# Patient Record
Sex: Female | Born: 1980 | Race: White | Hispanic: No | Marital: Single | Smoking: Current every day smoker
Health system: Southern US, Community
[De-identification: ages and names within clinical notes are randomized; demographics above are authoritative.]

## PROBLEM LIST (undated history)

## (undated) DIAGNOSIS — N200 Calculus of kidney: Secondary | ICD-10-CM

## (undated) DIAGNOSIS — F419 Anxiety disorder, unspecified: Secondary | ICD-10-CM

## (undated) DIAGNOSIS — N189 Chronic kidney disease, unspecified: Secondary | ICD-10-CM

## (undated) DIAGNOSIS — I219 Acute myocardial infarction, unspecified: Secondary | ICD-10-CM

## (undated) DIAGNOSIS — F1920 Other psychoactive substance dependence, uncomplicated: Secondary | ICD-10-CM

## (undated) DIAGNOSIS — F32A Depression, unspecified: Secondary | ICD-10-CM

## (undated) DIAGNOSIS — F329 Major depressive disorder, single episode, unspecified: Secondary | ICD-10-CM

## (undated) DIAGNOSIS — I82409 Acute embolism and thrombosis of unspecified deep veins of unspecified lower extremity: Secondary | ICD-10-CM

## (undated) HISTORY — PX: KIDNEY STONE SURGERY: SHX686

## (undated) HISTORY — PX: NASAL SINUS SURGERY: SHX719

---

## 2003-12-29 ENCOUNTER — Ambulatory Visit: Payer: Self-pay | Admitting: Unknown Physician Specialty

## 2004-10-14 ENCOUNTER — Emergency Department: Payer: Self-pay | Admitting: Emergency Medicine

## 2004-11-29 ENCOUNTER — Emergency Department: Payer: Self-pay | Admitting: Emergency Medicine

## 2005-01-16 ENCOUNTER — Ambulatory Visit: Payer: Self-pay | Admitting: Urology

## 2005-03-22 ENCOUNTER — Observation Stay: Payer: Self-pay | Admitting: Obstetrics & Gynecology

## 2005-03-24 ENCOUNTER — Ambulatory Visit: Payer: Self-pay | Admitting: Obstetrics & Gynecology

## 2005-03-24 ENCOUNTER — Ambulatory Visit: Payer: Self-pay | Admitting: Urology

## 2005-05-08 ENCOUNTER — Observation Stay: Payer: Self-pay | Admitting: Obstetrics & Gynecology

## 2005-06-04 ENCOUNTER — Observation Stay: Payer: Self-pay

## 2005-06-05 ENCOUNTER — Inpatient Hospital Stay: Payer: Self-pay

## 2005-08-13 ENCOUNTER — Emergency Department: Payer: Self-pay | Admitting: Emergency Medicine

## 2005-09-21 ENCOUNTER — Ambulatory Visit: Payer: Self-pay | Admitting: Urology

## 2005-09-26 ENCOUNTER — Ambulatory Visit: Payer: Self-pay | Admitting: Urology

## 2005-09-28 ENCOUNTER — Ambulatory Visit: Payer: Self-pay | Admitting: Urology

## 2005-10-01 ENCOUNTER — Emergency Department: Payer: Self-pay | Admitting: Emergency Medicine

## 2005-10-05 ENCOUNTER — Ambulatory Visit: Payer: Self-pay | Admitting: Urology

## 2005-11-24 ENCOUNTER — Inpatient Hospital Stay: Payer: Self-pay | Admitting: Psychiatry

## 2006-01-16 ENCOUNTER — Emergency Department: Payer: Self-pay | Admitting: Emergency Medicine

## 2006-01-22 ENCOUNTER — Ambulatory Visit: Payer: Self-pay | Admitting: Urology

## 2006-01-30 ENCOUNTER — Ambulatory Visit: Payer: Self-pay | Admitting: Urology

## 2006-02-21 ENCOUNTER — Ambulatory Visit: Payer: Self-pay | Admitting: Urology

## 2006-03-26 ENCOUNTER — Inpatient Hospital Stay: Payer: Self-pay | Admitting: Urology

## 2006-04-07 ENCOUNTER — Emergency Department: Payer: Self-pay | Admitting: General Practice

## 2006-04-25 ENCOUNTER — Ambulatory Visit: Payer: Self-pay | Admitting: Urology

## 2006-05-21 ENCOUNTER — Ambulatory Visit: Payer: Self-pay | Admitting: Pain Medicine

## 2006-05-23 ENCOUNTER — Ambulatory Visit: Payer: Self-pay | Admitting: Pain Medicine

## 2006-05-31 ENCOUNTER — Ambulatory Visit: Payer: Self-pay | Admitting: Neonatology

## 2006-05-31 ENCOUNTER — Ambulatory Visit: Payer: Self-pay | Admitting: Family Medicine

## 2006-06-13 ENCOUNTER — Ambulatory Visit: Payer: Self-pay | Admitting: Pain Medicine

## 2006-07-03 ENCOUNTER — Ambulatory Visit: Payer: Self-pay | Admitting: Pain Medicine

## 2006-07-23 ENCOUNTER — Ambulatory Visit: Payer: Self-pay | Admitting: Physician Assistant

## 2006-07-26 ENCOUNTER — Ambulatory Visit: Payer: Self-pay | Admitting: Urology

## 2006-09-14 ENCOUNTER — Emergency Department: Payer: Self-pay | Admitting: Emergency Medicine

## 2007-04-10 ENCOUNTER — Inpatient Hospital Stay: Payer: Self-pay | Admitting: Psychiatry

## 2007-07-18 ENCOUNTER — Emergency Department: Payer: Self-pay | Admitting: Emergency Medicine

## 2008-02-21 DIAGNOSIS — I82409 Acute embolism and thrombosis of unspecified deep veins of unspecified lower extremity: Secondary | ICD-10-CM

## 2008-02-21 HISTORY — DX: Acute embolism and thrombosis of unspecified deep veins of unspecified lower extremity: I82.409

## 2008-08-23 IMAGING — CT CT ABD-PELV W/O CM
1 of 2 series · 15 of 32 positions shown, 19 images · non-contrast
Comparison: none

REASON FOR EXAM: Nephrolithiasis, renal colic
COMMENTS:

PROCEDURE:     CT  - CT ABDOMEN AND PELVIS W[DATE]  [DATE]
RESULT:     The patient has a history of LEFT nephrolithiasis.
TECHNIQUE: Axial images were obtained from the hemidiaphragm to the pubic
symphysis without intravenous or oral administration of contrast.

[Series 2: stone · axial · 0.61mm/px · z∈[-312,+24]mm · 15 of 126 slices shown, 19 images]
[im 9/126  soft-tissue]
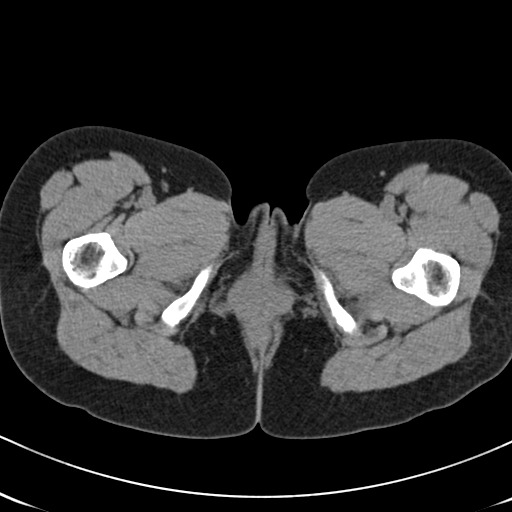
[im 9/126  bone]
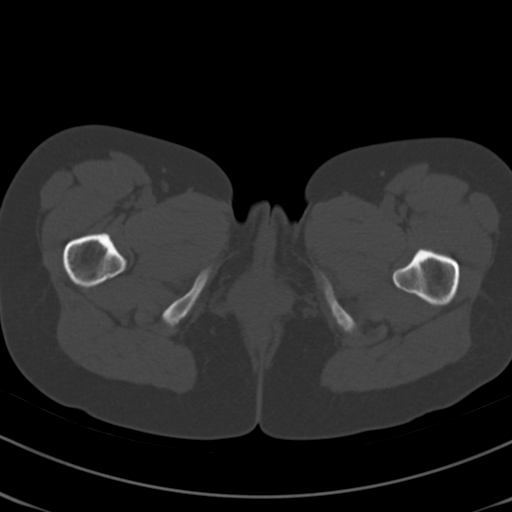
[im 18/126  soft-tissue]
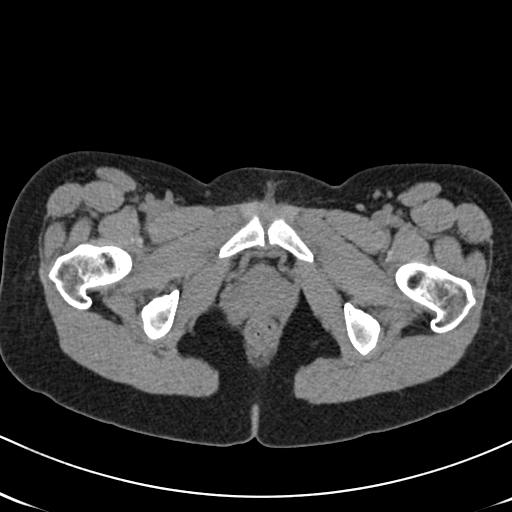
[im 27/126  soft-tissue]
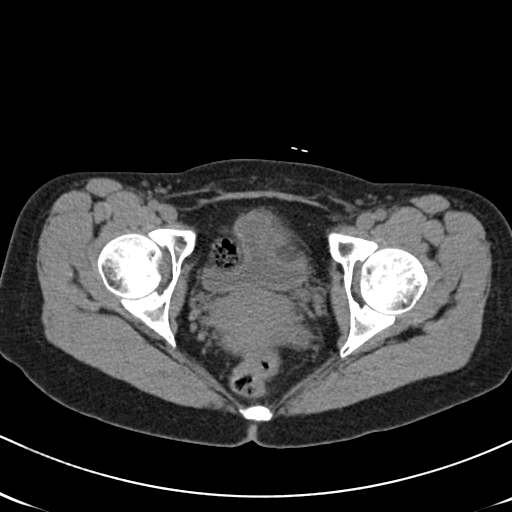
[im 36/126  soft-tissue]
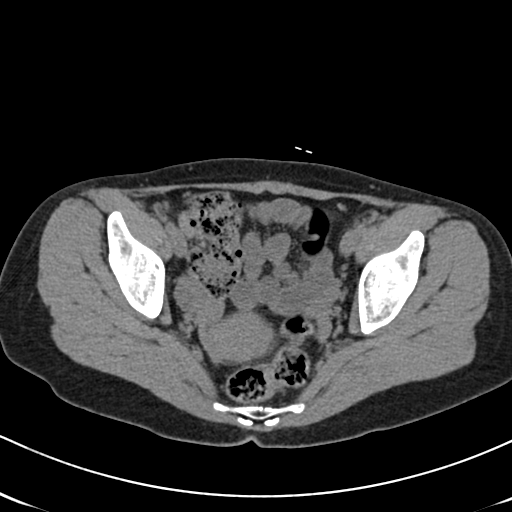
[im 45/126  soft-tissue]
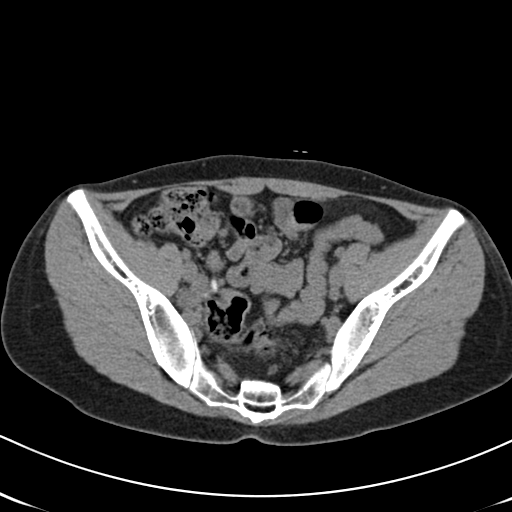
[im 54/126  soft-tissue]
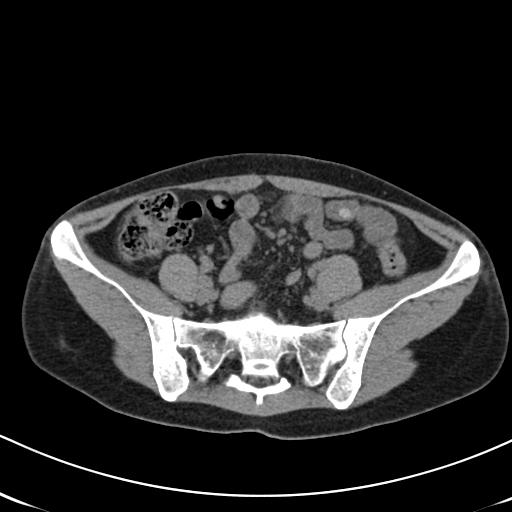
[im 63/126  soft-tissue]
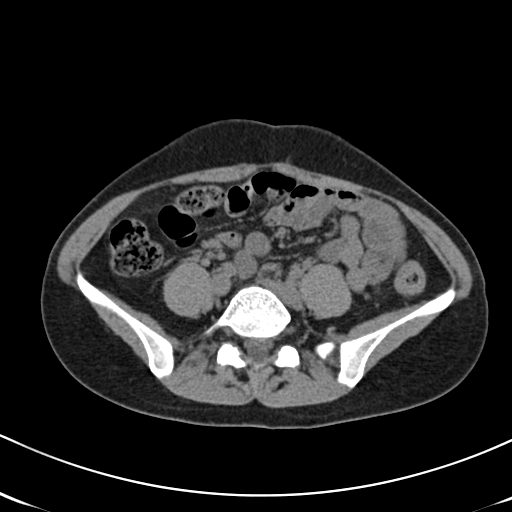
[im 72/126  soft-tissue]
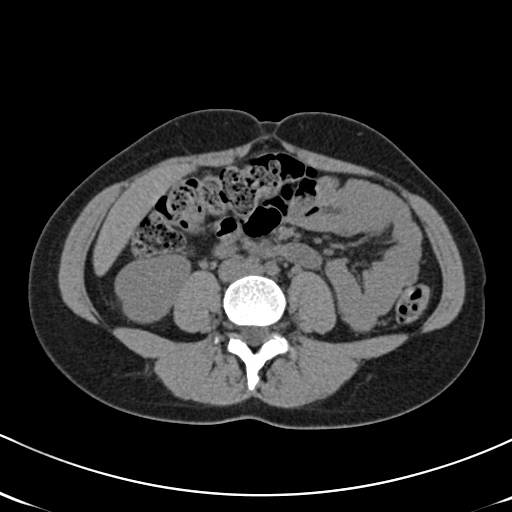
[im 81/126  soft-tissue]
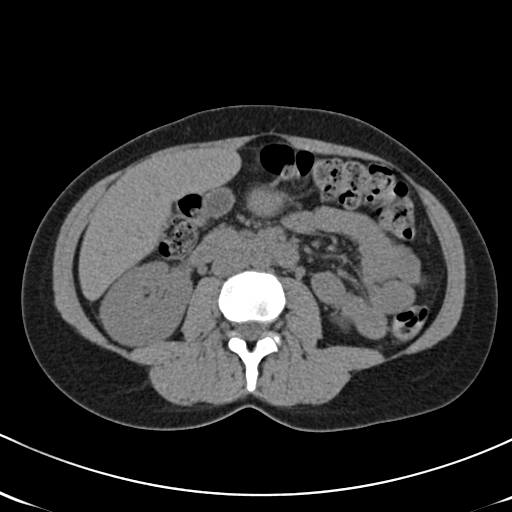
[im 81/126  bone]
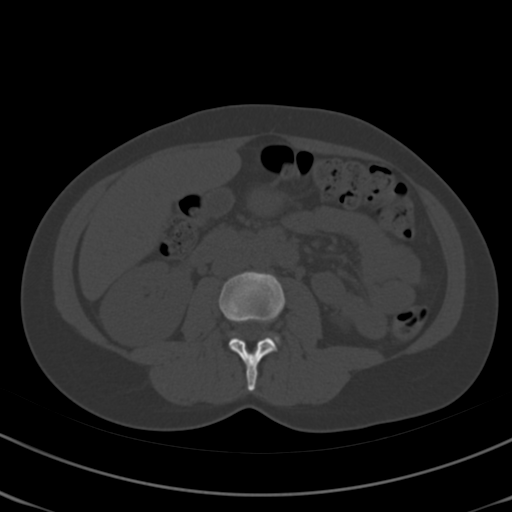
[im 90/126  soft-tissue]
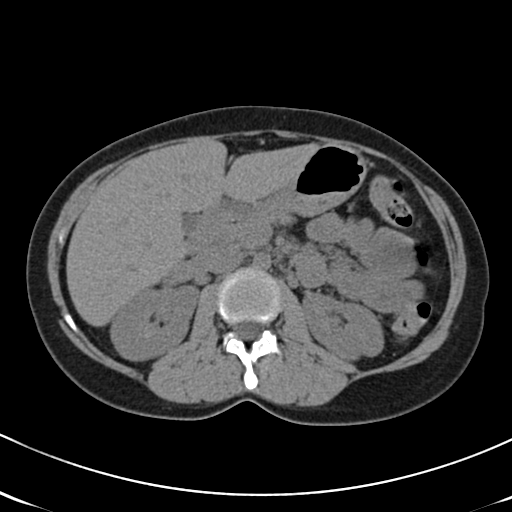
[im 99/126  soft-tissue]
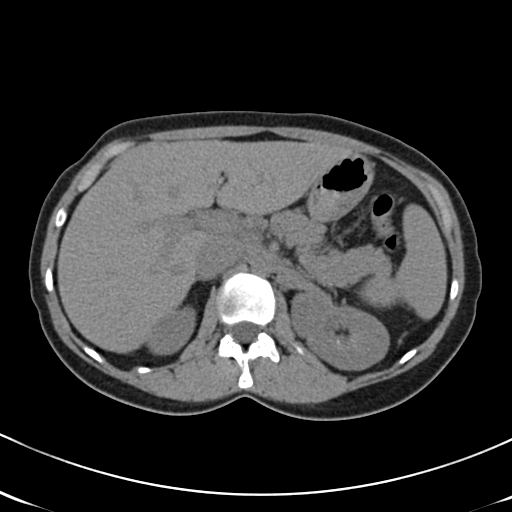
[im 108/126  soft-tissue]
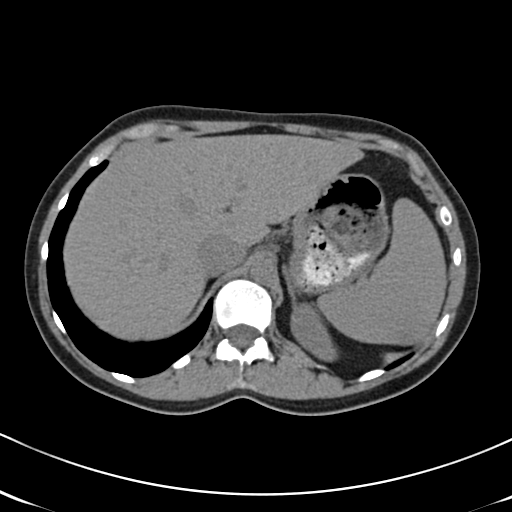
[im 108/126  lung]
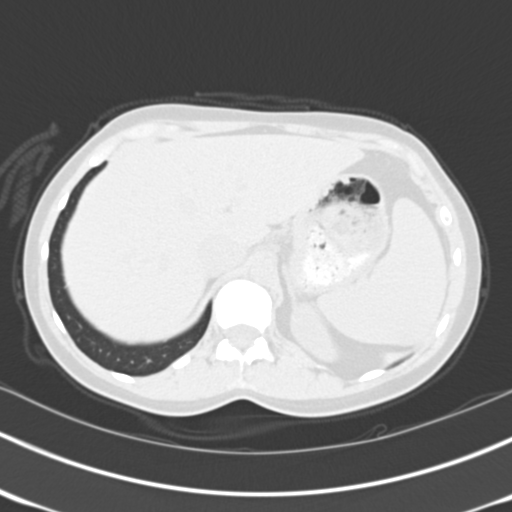
[im 112/126  lung]
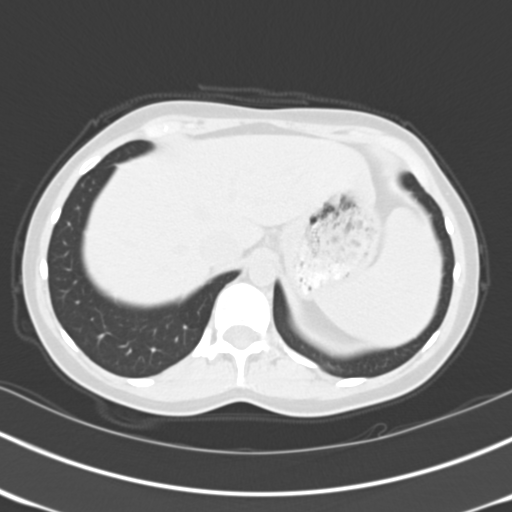
[im 117/126  soft-tissue]
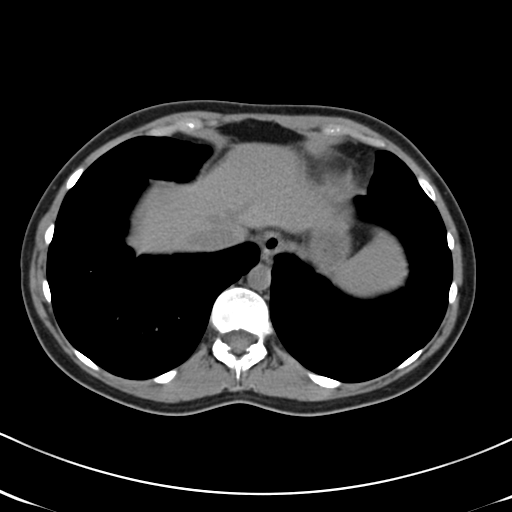
[im 117/126  lung]
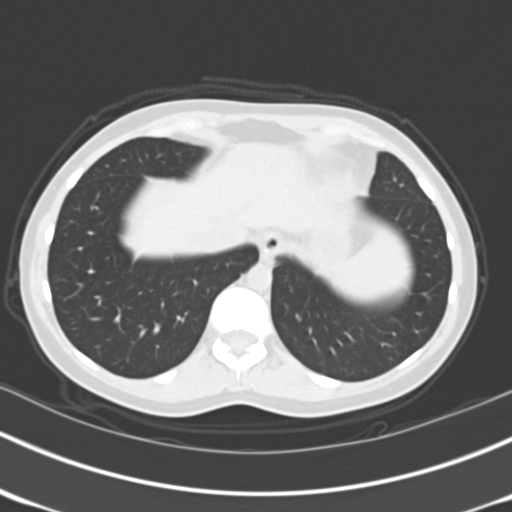
[im 121/126  lung]
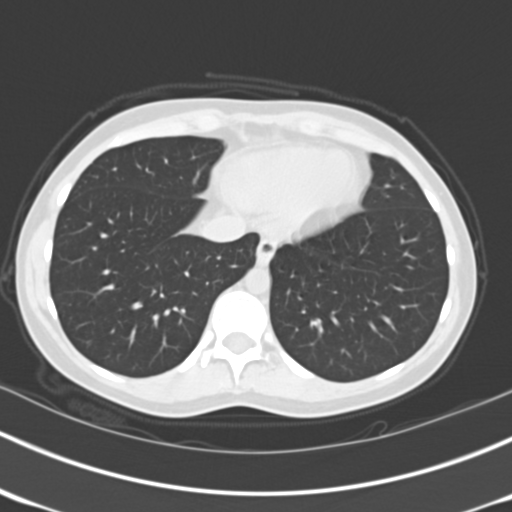

[15 of 32 positions shown; findings below may reference images not displayed]

FINDINGS: There are noted two, renal calculi in the LEFT kidney with one in
the mid pole measuring approximately 4.0 mm and one in the mid to lower pole
measuring slightly over 3.0 mm. There is a cyst in the upper pole of the
LEFT kidney. No calculi are noted on the RIGHT. No hydronephrosis is seen.
No ureteral calculi are seen. Bilateral, ovarian, small cysts are noted.

No ascites is present. No definite diverticulitis or appendicitis is seen.

Images through the lung bases reveal the lung bases to be clear with no
effusion.
IMPRESSION: 1.     Two calculi are noted in the LEFT kidney as well as a small cyst.
2.     No hydronephrosis.
3.     No ureteral calculi are identified.
4.     Small ovarian cysts are present bilaterally.

## 2008-12-16 ENCOUNTER — Observation Stay: Payer: Self-pay | Admitting: Unknown Physician Specialty

## 2008-12-26 ENCOUNTER — Inpatient Hospital Stay: Payer: Self-pay | Admitting: Obstetrics and Gynecology

## 2009-01-04 ENCOUNTER — Emergency Department: Payer: Self-pay | Admitting: Emergency Medicine

## 2009-01-15 ENCOUNTER — Ambulatory Visit: Payer: Self-pay | Admitting: Family Medicine

## 2009-10-27 ENCOUNTER — Ambulatory Visit: Payer: Self-pay

## 2009-12-15 ENCOUNTER — Ambulatory Visit: Payer: Self-pay | Admitting: Obstetrics and Gynecology

## 2009-12-15 LAB — CONVERTED CEMR LAB
AntiThromb III Func: 109 % (ref 76–126)
Antibody Screen: NEGATIVE
Anticardiolipin IgA: 8 (ref <22)
Anticardiolipin IgG: 60 — ABNORMAL HIGH (ref <23)
Anticardiolipin IgM: 7 (ref <11)
Basophils Absolute: 0 10*3/uL (ref 0.0–0.1)
Basophils Relative: 0 % (ref 0–1)
Chlamydia, DNA Probe: NEGATIVE
Eosinophils Absolute: 0.1 10*3/uL (ref 0.0–0.7)
Eosinophils Relative: 1 % (ref 0–5)
GC Probe Amp, Genital: NEGATIVE
HCT: 32.3 % — ABNORMAL LOW (ref 36.0–46.0)
HIV: NONREACTIVE
Hemoglobin: 10.6 g/dL — ABNORMAL LOW (ref 12.0–15.0)
Hepatitis B Surface Ag: NEGATIVE
Lymphocytes Relative: 32 % (ref 12–46)
Lymphs Abs: 2.7 10*3/uL (ref 0.7–4.0)
MCHC: 32.8 g/dL (ref 30.0–36.0)
MCV: 97 fL (ref 78.0–100.0)
Monocytes Absolute: 0.5 10*3/uL (ref 0.1–1.0)
Monocytes Relative: 6 % (ref 3–12)
Neutro Abs: 5.1 10*3/uL (ref 1.7–7.7)
Neutrophils Relative %: 61 % (ref 43–77)
Platelets: 199 10*3/uL (ref 150–400)
Protein C Activity: 152 % — ABNORMAL HIGH (ref 75–133)
Protein S Activity: 106 % (ref 69–129)
Protein S Ag, Total: 70 % (ref 70–140)
RBC: 3.33 M/uL — ABNORMAL LOW (ref 3.87–5.11)
RDW: 14.8 % (ref 11.5–15.5)
Rh Type: POSITIVE
Rubella: 306.9 intl units/mL — ABNORMAL HIGH
WBC: 8.4 10*3/uL (ref 4.0–10.5)

## 2009-12-16 ENCOUNTER — Ambulatory Visit (HOSPITAL_COMMUNITY)
Admission: RE | Admit: 2009-12-16 | Discharge: 2009-12-16 | Payer: Self-pay | Source: Home / Self Care | Admitting: Family Medicine

## 2009-12-23 ENCOUNTER — Ambulatory Visit: Payer: Self-pay | Admitting: Family Medicine

## 2010-01-10 ENCOUNTER — Ambulatory Visit: Payer: Self-pay | Admitting: Obstetrics & Gynecology

## 2010-02-03 ENCOUNTER — Ambulatory Visit: Payer: Self-pay | Admitting: Obstetrics & Gynecology

## 2010-02-10 ENCOUNTER — Ambulatory Visit (HOSPITAL_COMMUNITY)
Admission: RE | Admit: 2010-02-10 | Discharge: 2010-02-10 | Payer: Self-pay | Source: Home / Self Care | Attending: Obstetrics & Gynecology | Admitting: Obstetrics & Gynecology

## 2010-02-24 ENCOUNTER — Ambulatory Visit: Admit: 2010-02-24 | Payer: Self-pay | Admitting: Obstetrics & Gynecology

## 2010-03-03 ENCOUNTER — Ambulatory Visit
Admission: RE | Admit: 2010-03-03 | Discharge: 2010-03-03 | Payer: Self-pay | Source: Home / Self Care | Attending: Obstetrics and Gynecology | Admitting: Obstetrics and Gynecology

## 2010-03-07 LAB — POCT URINALYSIS DIPSTICK
Bilirubin Urine: NEGATIVE
Hgb urine dipstick: NEGATIVE
Nitrite: NEGATIVE
Protein, ur: NEGATIVE mg/dL
Specific Gravity, Urine: >=1.03 (ref 1.005–1.030)
Urine Glucose, Fasting: NEGATIVE mg/dL
Urobilinogen, UA: 1 mg/dL (ref 0.0–1.0)
pH: 6 (ref 5.0–8.0)

## 2010-03-10 ENCOUNTER — Ambulatory Visit: Admit: 2010-03-10 | Payer: Self-pay | Admitting: Obstetrics & Gynecology

## 2010-03-13 ENCOUNTER — Encounter: Payer: Self-pay | Admitting: *Deleted

## 2010-03-28 ENCOUNTER — Inpatient Hospital Stay (HOSPITAL_COMMUNITY)
Admission: AD | Admit: 2010-03-28 | Discharge: 2010-03-31 | DRG: 782 | Disposition: A | Discharge status: Home / Self Care | Payer: Medicaid Other | Source: Ambulatory Visit | Attending: Obstetrics & Gynecology | Admitting: Obstetrics & Gynecology

## 2010-03-28 DIAGNOSIS — I82409 Acute embolism and thrombosis of unspecified deep veins of unspecified lower extremity: Principal | ICD-10-CM | POA: Diagnosis present

## 2010-03-28 DIAGNOSIS — O2230 Deep phlebothrombosis in pregnancy, unspecified trimester: Secondary | ICD-10-CM

## 2010-03-28 DIAGNOSIS — I80299 Phlebitis and thrombophlebitis of other deep vessels of unspecified lower extremity: Secondary | ICD-10-CM

## 2010-03-28 LAB — COMPREHENSIVE METABOLIC PANEL
ALT: 11 U/L (ref 0–35)
AST: 15 U/L (ref 0–37)
Albumin: 2.3 g/dL — ABNORMAL LOW (ref 3.5–5.2)
Alkaline Phosphatase: 103 U/L (ref 39–117)
BUN: 8 mg/dL (ref 6–23)
CO2: 25 mEq/L (ref 19–32)
Calcium: 8.1 mg/dL — ABNORMAL LOW (ref 8.4–10.5)
Chloride: 104 mEq/L (ref 96–112)
Creatinine, Ser: 0.56 mg/dL (ref 0.4–1.2)
GFR calc Af Amer: >60 mL/min (ref >60)
GFR calc non Af Amer: >60 mL/min (ref >60)
Glucose, Bld: 72 mg/dL (ref 70–99)
Potassium: 4.3 mEq/L (ref 3.5–5.1)
Sodium: 135 mEq/L (ref 135–145)
Total Bilirubin: 0.2 mg/dL — ABNORMAL LOW (ref 0.3–1.2)
Total Protein: 6.1 g/dL (ref 6.0–8.3)

## 2010-03-28 LAB — CBC
HCT: 27.3 % — ABNORMAL LOW (ref 36.0–46.0)
Hemoglobin: 8.9 g/dL — ABNORMAL LOW (ref 12.0–15.0)
MCH: 28.4 pg (ref 26.0–34.0)
MCHC: 32.6 g/dL (ref 30.0–36.0)
MCV: 87.2 fL (ref 78.0–100.0)
Platelets: 135 10*3/uL — ABNORMAL LOW (ref 150–400)
RBC: 3.13 MIL/uL — ABNORMAL LOW (ref 3.87–5.11)
RDW: 13.3 % (ref 11.5–15.5)
WBC: 8.6 10*3/uL (ref 4.0–10.5)

## 2010-03-28 LAB — RAPID URINE DRUG SCREEN, HOSP PERFORMED
Amphetamines: NOT DETECTED
Barbiturates: NOT DETECTED
Benzodiazepines: NOT DETECTED
Cocaine: NOT DETECTED
Opiates: NOT DETECTED
Tetrahydrocannabinol: NOT DETECTED

## 2010-03-28 LAB — APTT: aPTT: 49 seconds — ABNORMAL HIGH (ref 24–37)

## 2010-03-28 LAB — PROTIME-INR
INR: 1 (ref 0.00–1.49)
Prothrombin Time: 13.4 seconds (ref 11.6–15.2)

## 2010-03-28 LAB — HEPARIN LEVEL (UNFRACTIONATED): Heparin Unfractionated: 0.12 IU/mL — ABNORMAL LOW (ref 0.30–0.70)

## 2010-03-29 DIAGNOSIS — O2230 Deep phlebothrombosis in pregnancy, unspecified trimester: Secondary | ICD-10-CM

## 2010-03-29 DIAGNOSIS — I82409 Acute embolism and thrombosis of unspecified deep veins of unspecified lower extremity: Secondary | ICD-10-CM

## 2010-03-29 LAB — CBC
HCT: 23.9 % — ABNORMAL LOW (ref 36.0–46.0)
Hemoglobin: 7.9 g/dL — ABNORMAL LOW (ref 12.0–15.0)
MCH: 28.7 pg (ref 26.0–34.0)
MCHC: 33.1 g/dL (ref 30.0–36.0)
MCV: 86.9 fL (ref 78.0–100.0)
Platelets: 121 10*3/uL — ABNORMAL LOW (ref 150–400)
RBC: 2.75 MIL/uL — ABNORMAL LOW (ref 3.87–5.11)
RDW: 13.4 % (ref 11.5–15.5)
WBC: 7.6 10*3/uL (ref 4.0–10.5)

## 2010-03-29 LAB — HEPARIN LEVEL (UNFRACTIONATED): Heparin Unfractionated: <0.1 IU/mL — ABNORMAL LOW (ref 0.30–0.70)

## 2010-03-30 DIAGNOSIS — I82409 Acute embolism and thrombosis of unspecified deep veins of unspecified lower extremity: Secondary | ICD-10-CM

## 2010-03-30 DIAGNOSIS — O2230 Deep phlebothrombosis in pregnancy, unspecified trimester: Secondary | ICD-10-CM

## 2010-03-30 LAB — CBC
HCT: 25.4 % — ABNORMAL LOW (ref 36.0–46.0)
Hemoglobin: 8.2 g/dL — ABNORMAL LOW (ref 12.0–15.0)
MCH: 27.9 pg (ref 26.0–34.0)
MCHC: 32.3 g/dL (ref 30.0–36.0)
MCV: 86.4 fL (ref 78.0–100.0)
Platelets: 122 10*3/uL — ABNORMAL LOW (ref 150–400)
RBC: 2.94 MIL/uL — ABNORMAL LOW (ref 3.87–5.11)
RDW: 13.3 % (ref 11.5–15.5)
WBC: 7.2 10*3/uL (ref 4.0–10.5)

## 2010-03-30 LAB — GLUCOSE TOLERANCE, 1 HOUR: Glucose, 1 Hour GTT: 147 mg/dL — ABNORMAL HIGH (ref 70–140)

## 2010-03-30 LAB — HEPARIN ANTI-XA: Heparin LMW: 0.63 IU/mL

## 2010-03-31 DIAGNOSIS — O2230 Deep phlebothrombosis in pregnancy, unspecified trimester: Secondary | ICD-10-CM

## 2010-03-31 DIAGNOSIS — I82409 Acute embolism and thrombosis of unspecified deep veins of unspecified lower extremity: Secondary | ICD-10-CM

## 2010-03-31 LAB — GLUCOSE, 1 HOUR GESTATIONAL: Glucose Tolerance, 1 hour: 120 mg/dL (ref 70–189)

## 2010-03-31 LAB — CBC
HCT: 26.1 % — ABNORMAL LOW (ref 36.0–46.0)
Hemoglobin: 8.4 g/dL — ABNORMAL LOW (ref 12.0–15.0)
MCH: 27.8 pg (ref 26.0–34.0)
MCHC: 32.2 g/dL (ref 30.0–36.0)
MCV: 86.4 fL (ref 78.0–100.0)
Platelets: 122 10*3/uL — ABNORMAL LOW (ref 150–400)
RBC: 3.02 MIL/uL — ABNORMAL LOW (ref 3.87–5.11)
RDW: 13.3 % (ref 11.5–15.5)
WBC: 8.4 10*3/uL (ref 4.0–10.5)

## 2010-03-31 LAB — GLUCOSE, 3 HOUR GESTATIONAL: Glucose, GTT - 3 Hour: 102 mg/dL (ref 70–144)

## 2010-03-31 LAB — GLUCOSE, 2 HOUR GESTATIONAL: Glucose Tolerance, 2 hour: 125 mg/dL (ref 70–164)

## 2010-03-31 LAB — GLUCOSE, FASTING GESTATIONAL: Glucose Tolerance, Fasting: 85 mg/dL

## 2010-04-07 NOTE — Discharge Summary (Signed)
NAMENAYELY, DINGUS              ACCOUNT NO.:  [id number]  MEDICAL RECORD NO.:  [id number]           PATIENT TYPE:  LOCATION:                                 FACILITY:  PHYSICIAN:  Lucina Mellow, DO   DATE OF BIRTH:  February 15, 1981  DATE OF ADMISSION:  03/28/2010 DATE OF DISCHARGE:  03/31/2010                              DISCHARGE SUMMARY   DIAGNOSIS:  Extensive right lower extremity deep venous thrombosis.  HISTORY OF PRESENT ILLNESS:  The patient is a 30 year old female gravida 5, para 4-0-0-4 who presented to the MAU with the chief complaint of lower extremity swelling.  She was on the Lovenox for history of a postpartum DVT in the past but upon presentation to MAU, she had admitted to stopping her Lovenox about 3-4 weeks prior to presentation "because I was a bad patient.  I did not think I needed it because I never had a DVT during pregnancy."  The patient is also on methadone and has been on it for the past several years at a dose of 75-80 mg a day. She has never had her dose weaned because she did want it weaned.  At presentation, she did not complain of shortness of breath or chest pain and she at that time also denied leaking, bleeding from the vagina and she reported good fetal movement.  PAST MEDICAL HISTORY:  Anemia, bipolar disorder, depression, DVT, hernia.  FAMILY HISTORY:  Diabetes, drug abuse, alcohol abuse.  SURGICAL HISTORY:  Sinus surgery, kidney for surgical biopsy, lithotripsy of the bladder with calculus.  SOCIAL HISTORY:  She is a nondrinker.  She is a current smoker with former drug abuse and currently is on methadone.  OBSTETRICAL HISTORY:  She has had regular prenatal care and has a history of ovarian cysts.  MEDICATIONS:  Lovenox she was on 40 mg daily subcu, Vistaril 50 mg p.o. at bedtime, methadone 80 mg p.o. daily, and prenatal vitamins.  The patient had an ultrasound done in the MAU which failed an extensive right lower extremity DVT.   Her laboratory values, she had a urine drug screen which was negative, a renal panel which was normal, and a CBC which showed a hemoglobin of 8.9, hematocrit of 27.3, and platelets of 135.  HOSPITAL COURSE:  The patient was admitted to the hospital and placed on heparin protocol per the pharmacy and she received 5000 units IV as a bolus and then maintenance infusion of 1160 units per hour and a heparin level was checked 6 hours after the heparin infusion.  Later in the day after admission, she received a 2000 units bolus and her heparin drip was increased to 1350 units per hour and again an unfractionated heparin level was checked.  The morning of February 7, she received another bolus of 2000 units and her infusion was increased to 1680 units per hour.  Later in the day on February 7, her heparin drip was discontinued and she was started on Lovenox 70 mg subcu q.12 h. and she had her 1- hour Glucola test on February 7, which came back elevated.  Her 1-hour Glucola was 147 and she  was written to have a 3-hour glucose tolerance test prior to discharge.  On February 9, the patient was seen and she was doing well.  Her pain continued in her low right lower extremity; however, she declined any pain medicine at this time.  She was premedicated with Zofran prior to her glucose tolerance test and a decision was made that she could be discharged home after the last blood draw for her 3-hour glucose tolerance test.  She was seen by social work, the note is in the chart.  She was seen by pharmacy for the heparin titration.  She continued to receive her 80 mg a day of her methadone during her admission.  Her platelet count remained somewhat low.  The lowest it got was 122, this will need to be followed.  On discharge, the patient's vital signs are 96/41, heart rate is 73, respiratory rate of 18, temperature 97.9.  In general she is awake, alert, and oriented in no apparent distress.  Her heart rate  is regular rate and rhythm.  She has a 2/6 systolic ejection murmur, best heard at left sternal border.  Her lungs were clear to auscultation bilaterally. Extremity, she does have pulses bilaterally.  She has 2+ pitting edema in her right lower extremity, mostly the foot with increased sensitivity to plain gentle touch on her foot.  There is continued swelling of the right lower extremity to the knee.  Her NST was reactive with a fetal heart rate in the 140s.  ASSESSMENT:  A 30 year old gravida 5, para 4-0-0-4 at 31-5/7 weeks with the right lower extremity deep venous thrombosis.  She is now on Lovenox.  She will be discharged home today on 70 mg subcu Lovenox b.i.d.  She will be discharged today only after she has gotten her third blood draw for her Glucola test.  We will give her Zofran for p.r.n. nausea after discharge.  She declines pain medications for her pain in her right lower extremity.  She will continue on [facility] and her followup appointment is scheduled for February 16 at 9:45 in the morning.  The patient voices understanding and agrees with these plans.          ______________________________ Lucina Mellow, DO     SH/MEDQ  D:  03/31/2010  T:  04/01/2010  Job:  [id number]  Electronically Signed by Lucina Mellow MD on 04/07/2010 07:06:39 PM

## 2010-04-14 ENCOUNTER — Other Ambulatory Visit: Payer: Self-pay

## 2010-04-14 ENCOUNTER — Other Ambulatory Visit: Payer: Self-pay | Admitting: Obstetrics & Gynecology

## 2010-04-14 ENCOUNTER — Encounter: Payer: Self-pay | Admitting: Obstetrics and Gynecology

## 2010-04-14 DIAGNOSIS — Z8751 Personal history of pre-term labor: Secondary | ICD-10-CM

## 2010-04-14 DIAGNOSIS — I82409 Acute embolism and thrombosis of unspecified deep veins of unspecified lower extremity: Secondary | ICD-10-CM

## 2010-04-14 DIAGNOSIS — O2230 Deep phlebothrombosis in pregnancy, unspecified trimester: Secondary | ICD-10-CM

## 2010-04-14 LAB — POCT URINALYSIS DIPSTICK
Hgb urine dipstick: NEGATIVE
Nitrite: NEGATIVE
Protein, ur: 30 mg/dL — AB
Specific Gravity, Urine: >=1.03 (ref 1.005–1.030)
Urine Glucose, Fasting: 100 mg/dL — AB
Urobilinogen, UA: 1 mg/dL (ref 0.0–1.0)
pH: 6 (ref 5.0–8.0)

## 2010-04-14 LAB — GLUCOSE, CAPILLARY: Glucose-Capillary: 104 mg/dL — ABNORMAL HIGH (ref 70–99)

## 2010-04-14 LAB — CONVERTED CEMR LAB: HIV: NONREACTIVE

## 2010-04-19 ENCOUNTER — Ambulatory Visit (HOSPITAL_COMMUNITY)
Admission: RE | Admit: 2010-04-19 | Discharge: 2010-04-19 | Disposition: A | Discharge status: Home / Self Care | Payer: Medicaid Other | Source: Ambulatory Visit | Attending: Obstetrics & Gynecology | Admitting: Obstetrics & Gynecology

## 2010-04-19 ENCOUNTER — Encounter (HOSPITAL_COMMUNITY): Payer: Self-pay

## 2010-04-19 DIAGNOSIS — F1920 Other psychoactive substance dependence, uncomplicated: Secondary | ICD-10-CM | POA: Insufficient documentation

## 2010-04-19 DIAGNOSIS — F1120 Opioid dependence, uncomplicated: Secondary | ICD-10-CM | POA: Insufficient documentation

## 2010-04-19 DIAGNOSIS — O99330 Smoking (tobacco) complicating pregnancy, unspecified trimester: Secondary | ICD-10-CM | POA: Insufficient documentation

## 2010-04-19 DIAGNOSIS — O09219 Supervision of pregnancy with history of pre-term labor, unspecified trimester: Secondary | ICD-10-CM | POA: Insufficient documentation

## 2010-04-19 DIAGNOSIS — Z8751 Personal history of pre-term labor: Secondary | ICD-10-CM

## 2010-04-28 ENCOUNTER — Other Ambulatory Visit: Payer: Self-pay

## 2010-04-28 DIAGNOSIS — O99320 Drug use complicating pregnancy, unspecified trimester: Secondary | ICD-10-CM

## 2010-04-28 DIAGNOSIS — Z8751 Personal history of pre-term labor: Secondary | ICD-10-CM

## 2010-04-28 DIAGNOSIS — F1920 Other psychoactive substance dependence, uncomplicated: Secondary | ICD-10-CM

## 2010-04-28 DIAGNOSIS — O99330 Smoking (tobacco) complicating pregnancy, unspecified trimester: Secondary | ICD-10-CM

## 2010-04-28 DIAGNOSIS — O99340 Other mental disorders complicating pregnancy, unspecified trimester: Secondary | ICD-10-CM

## 2010-04-28 LAB — POCT URINALYSIS DIPSTICK
Bilirubin Urine: NEGATIVE
Hgb urine dipstick: NEGATIVE
Ketones, ur: NEGATIVE mg/dL
pH: 7 (ref 5.0–8.0)

## 2010-05-02 LAB — POCT URINALYSIS DIPSTICK
Ketones, ur: NEGATIVE mg/dL
Protein, ur: NEGATIVE mg/dL
Specific Gravity, Urine: 1.015 (ref 1.005–1.030)

## 2010-05-03 LAB — POCT URINALYSIS DIPSTICK
Bilirubin Urine: NEGATIVE
Glucose, UA: NEGATIVE mg/dL
Hgb urine dipstick: NEGATIVE
Hgb urine dipstick: NEGATIVE
Ketones, ur: NEGATIVE mg/dL
Ketones, ur: NEGATIVE mg/dL
Specific Gravity, Urine: 1.02 (ref 1.005–1.030)
Specific Gravity, Urine: 1.02 (ref 1.005–1.030)
Urobilinogen, UA: 0.2 mg/dL (ref 0.0–1.0)
pH: 7.5 (ref 5.0–8.0)

## 2010-05-04 LAB — POCT URINALYSIS DIPSTICK
Hgb urine dipstick: NEGATIVE
Ketones, ur: NEGATIVE mg/dL
Protein, ur: NEGATIVE mg/dL
Specific Gravity, Urine: 1.02 (ref 1.005–1.030)
pH: 8.5 — ABNORMAL HIGH (ref 5.0–8.0)

## 2010-05-05 ENCOUNTER — Encounter: Payer: Self-pay | Admitting: Physician Assistant

## 2010-05-05 ENCOUNTER — Other Ambulatory Visit: Payer: Self-pay

## 2010-05-05 DIAGNOSIS — F1920 Other psychoactive substance dependence, uncomplicated: Secondary | ICD-10-CM

## 2010-05-05 DIAGNOSIS — O99320 Drug use complicating pregnancy, unspecified trimester: Secondary | ICD-10-CM

## 2010-05-05 DIAGNOSIS — O99330 Smoking (tobacco) complicating pregnancy, unspecified trimester: Secondary | ICD-10-CM

## 2010-05-05 DIAGNOSIS — Z8751 Personal history of pre-term labor: Secondary | ICD-10-CM

## 2010-05-05 LAB — POCT URINALYSIS DIPSTICK
Hgb urine dipstick: NEGATIVE
Protein, ur: 100 mg/dL — AB
Specific Gravity, Urine: >=1.03 (ref 1.005–1.030)
Urobilinogen, UA: 1 mg/dL (ref 0.0–1.0)

## 2010-05-12 ENCOUNTER — Other Ambulatory Visit: Payer: Self-pay | Admitting: Obstetrics & Gynecology

## 2010-05-12 DIAGNOSIS — Z331 Pregnant state, incidental: Secondary | ICD-10-CM

## 2010-05-12 DIAGNOSIS — O4700 False labor before 37 completed weeks of gestation, unspecified trimester: Secondary | ICD-10-CM

## 2010-05-12 DIAGNOSIS — O99340 Other mental disorders complicating pregnancy, unspecified trimester: Secondary | ICD-10-CM

## 2010-05-12 DIAGNOSIS — Z8751 Personal history of pre-term labor: Secondary | ICD-10-CM

## 2010-05-12 LAB — POCT URINALYSIS DIP (DEVICE)
Bilirubin Urine: NEGATIVE
Ketones, ur: NEGATIVE mg/dL
Protein, ur: 30 mg/dL — AB
Specific Gravity, Urine: 1.025 (ref 1.005–1.030)
pH: 5.5 (ref 5.0–8.0)

## 2010-05-19 ENCOUNTER — Other Ambulatory Visit: Payer: Self-pay | Admitting: Family Medicine

## 2010-05-19 DIAGNOSIS — O09219 Supervision of pregnancy with history of pre-term labor, unspecified trimester: Secondary | ICD-10-CM

## 2010-05-19 DIAGNOSIS — F1920 Other psychoactive substance dependence, uncomplicated: Secondary | ICD-10-CM

## 2010-05-19 DIAGNOSIS — O99320 Drug use complicating pregnancy, unspecified trimester: Secondary | ICD-10-CM

## 2010-05-19 LAB — POCT URINALYSIS DIP (DEVICE)
Glucose, UA: NEGATIVE mg/dL
Hgb urine dipstick: NEGATIVE
Ketones, ur: NEGATIVE mg/dL
Protein, ur: NEGATIVE mg/dL
Specific Gravity, Urine: 1.025 (ref 1.005–1.030)
Urobilinogen, UA: 0.2 mg/dL (ref 0.0–1.0)

## 2010-05-23 ENCOUNTER — Inpatient Hospital Stay (HOSPITAL_COMMUNITY)
Admission: RE | Admit: 2010-05-23 | Discharge: 2010-05-27 | DRG: 774 | Disposition: A | Discharge status: Home / Self Care | Payer: Medicaid Other | Source: Ambulatory Visit | Attending: Obstetrics & Gynecology | Admitting: Obstetrics & Gynecology

## 2010-05-23 DIAGNOSIS — D689 Coagulation defect, unspecified: Secondary | ICD-10-CM | POA: Diagnosis present

## 2010-05-23 DIAGNOSIS — I82409 Acute embolism and thrombosis of unspecified deep veins of unspecified lower extremity: Principal | ICD-10-CM | POA: Diagnosis present

## 2010-05-23 DIAGNOSIS — I824Z9 Acute embolism and thrombosis of unspecified deep veins of unspecified distal lower extremity: Secondary | ICD-10-CM

## 2010-05-23 DIAGNOSIS — O2230 Deep phlebothrombosis in pregnancy, unspecified trimester: Secondary | ICD-10-CM

## 2010-05-23 DIAGNOSIS — D696 Thrombocytopenia, unspecified: Secondary | ICD-10-CM | POA: Diagnosis present

## 2010-05-23 LAB — CBC
Hemoglobin: 8.1 g/dL — ABNORMAL LOW (ref 12.0–15.0)
MCH: 26.3 pg (ref 26.0–34.0)
MCH: 25.4 pg — ABNORMAL LOW (ref 26.0–34.0)
MCHC: 32.4 g/dL (ref 30.0–36.0)
MCHC: 30.8 g/dL (ref 30.0–36.0)
MCV: 81.4 fL (ref 78.0–100.0)
Platelets: 97 10*3/uL — ABNORMAL LOW (ref 150–400)
RBC: 3.38 MIL/uL — ABNORMAL LOW (ref 3.87–5.11)
RDW: 14.6 % (ref 11.5–15.5)

## 2010-05-23 LAB — RPR: RPR Ser Ql: NONREACTIVE

## 2010-05-24 DIAGNOSIS — D689 Coagulation defect, unspecified: Secondary | ICD-10-CM

## 2010-05-24 LAB — CBC
HCT: 26.7 % — ABNORMAL LOW (ref 36.0–46.0)
Hemoglobin: 8.3 g/dL — ABNORMAL LOW (ref 12.0–15.0)
MCH: 25.5 pg — ABNORMAL LOW (ref 26.0–34.0)
MCHC: 31.1 g/dL (ref 30.0–36.0)
RDW: 14.5 % (ref 11.5–15.5)

## 2010-05-24 LAB — PROTIME-INR
INR: 0.91 (ref 0.00–1.49)
Prothrombin Time: 12.5 seconds (ref 11.6–15.2)
Prothrombin Time: 13.1 seconds (ref 11.6–15.2)

## 2010-05-25 LAB — URINALYSIS, MICROSCOPIC ONLY
Bilirubin Urine: NEGATIVE
Glucose, UA: NEGATIVE mg/dL
Ketones, ur: NEGATIVE mg/dL
Protein, ur: NEGATIVE mg/dL

## 2010-05-25 LAB — COMPREHENSIVE METABOLIC PANEL
ALT: 21 U/L (ref 0–35)
Alkaline Phosphatase: 149 U/L — ABNORMAL HIGH (ref 39–117)
BUN: 10 mg/dL (ref 6–23)
CO2: 24 mEq/L (ref 19–32)
GFR calc non Af Amer: >60 mL/min (ref >60)
Glucose, Bld: 75 mg/dL (ref 70–99)
Potassium: 4.1 mEq/L (ref 3.5–5.1)
Sodium: 136 mEq/L (ref 135–145)
Total Bilirubin: 0.3 mg/dL (ref 0.3–1.2)

## 2010-05-25 LAB — PROTEIN C, TOTAL: Protein C, Total: 66 % — ABNORMAL LOW (ref 72–160)

## 2010-05-25 LAB — CBC
HCT: 28.3 % — ABNORMAL LOW (ref 36.0–46.0)
Hemoglobin: 8.8 g/dL — ABNORMAL LOW (ref 12.0–15.0)
MCV: 81.8 fL (ref 78.0–100.0)
WBC: 9.2 10*3/uL (ref 4.0–10.5)

## 2010-05-25 LAB — LUPUS ANTICOAGULANT PANEL
DRVVT: 80.5 secs — ABNORMAL HIGH (ref 36.2–44.3)
PTT Lupus Anticoagulant: 63.3 secs — ABNORMAL HIGH (ref 30.0–45.6)
PTTLA Confirmation: 18.2 secs — ABNORMAL HIGH (ref <8.0)

## 2010-05-25 LAB — ANTITHROMBIN III: AntiThromb III Func: 91 % (ref 76–126)

## 2010-05-25 LAB — PROTIME-INR: INR: 0.88 (ref 0.00–1.49)

## 2010-05-26 LAB — HEPARIN INDUCED THROMBOCYTOPENIA PNL
Heparin Induced Plt Ab: NEGATIVE
UFH High Dose UFH H: 8 % Release
UFH SRA Result: NEGATIVE

## 2010-05-26 LAB — CBC
Hemoglobin: 8.7 g/dL — ABNORMAL LOW (ref 12.0–15.0)
MCV: 81.9 fL (ref 78.0–100.0)
Platelets: 94 10*3/uL — ABNORMAL LOW (ref 150–400)
RBC: 3.43 MIL/uL — ABNORMAL LOW (ref 3.87–5.11)
WBC: 9.3 10*3/uL (ref 4.0–10.5)

## 2010-05-26 LAB — PROTHROMBIN GENE MUTATION

## 2010-05-27 LAB — CARDIOLIPIN ANTIBODIES, IGG, IGM, IGA
Anticardiolipin IgA: 12 APL U/mL — ABNORMAL LOW (ref <22)
Anticardiolipin IgG: 88 GPL U/mL — ABNORMAL HIGH (ref <23)

## 2010-05-27 NOTE — Consult Note (Signed)
  NAMESELA, FALK              ACCOUNT NO.:  [id number]  MEDICAL RECORD NO.:  [id number]           PATIENT TYPE:  I  LOCATION:  9124                          [facility]ILITY:  [facility]  PHYSICIAN:  Valentino Hue. Kealan Buchan, M.D.DATE OF BIRTH:  11/20/1980  DATE OF CONSULTATION: DATE OF DISCHARGE:                                CONSULTATION   ADDENDUM  Molly Dickerson's hypercoagulable panel confirms that she has so called lupus anticoagulant.  This is associated with a factitious elevation of the PTT as indeed is the case in this patient whose admission PTT was 49. The rest of the hypercoagulable panel is negative and in particular, she had a normal antithrombin protein C and functional protein S level.  The total protein S was slightly diminished as is common and unremarkable in pregnancy.  The patient was negative for lupus for the factor V Leiden mutation.  I have discussed this diagnosis with the patient and her husband and given them the information in writing.  The diagnosis is chiefly important for future reference, especially if Molly Dickerson becomes pregnant again or undergoes surgery or suffers trauma.  I will make an appointment for her to see me in June.  She specifically requests a Thursday around 10:30 in the morning since she is in [place] around that time for a meeting every week.  From the management point of view, her current DVT should be treated routinely as you are doing with a total of 6 months of anticoagulation.  Monitoring in her case is best done from her primary physician in [place] as was done with her prior pregnancy-associated DVT.  Note in addition that the patient's HIT test was negative.  Finally, her platelets are recovering nicely consistent with a benign thrombocytopenia of pregnancy.     Valentino Hue. Symphany Fleissner, M.D.     Ronna Polio  D:  05/26/2010  T:  05/27/2010  Job:  [id number]  Electronically Signed by Ruthann Cancer M.D. on 05/27/2010 12:43:21 PM

## 2010-06-01 ENCOUNTER — Inpatient Hospital Stay (HOSPITAL_COMMUNITY)
Admission: AD | Admit: 2010-06-01 | Discharge: 2010-06-01 | Disposition: A | Discharge status: Home / Self Care | Payer: Medicaid Other | Source: Ambulatory Visit | Attending: Obstetrics & Gynecology | Admitting: Obstetrics & Gynecology

## 2010-06-01 DIAGNOSIS — L02419 Cutaneous abscess of limb, unspecified: Secondary | ICD-10-CM

## 2010-06-01 DIAGNOSIS — L03119 Cellulitis of unspecified part of limb: Secondary | ICD-10-CM

## 2010-06-01 LAB — CBC
Platelets: 238 10*3/uL (ref 150–400)
RBC: 3.7 MIL/uL — ABNORMAL LOW (ref 3.87–5.11)
WBC: 9.6 10*3/uL (ref 4.0–10.5)

## 2010-06-01 LAB — APTT: aPTT: 79 seconds — ABNORMAL HIGH (ref 24–37)

## 2010-06-01 LAB — RAPID URINE DRUG SCREEN, HOSP PERFORMED
Barbiturates: NOT DETECTED
Benzodiazepines: NOT DETECTED
Cocaine: NOT DETECTED
Opiates: NOT DETECTED

## 2010-06-01 LAB — PROTIME-INR: INR: 2.17 — ABNORMAL HIGH (ref 0.00–1.49)

## 2010-06-06 ENCOUNTER — Other Ambulatory Visit: Payer: Medicaid Other

## 2010-06-06 NOTE — Consult Note (Signed)
Molly Dickerson, Molly Dickerson              ACCOUNT NO.:  [id number]  MEDICAL RECORD NO.:  [id number]           PATIENT TYPE:  I  LOCATION:  9124                          [facility]ILITY:  [facility]  PHYSICIAN:  Valentino Hue. Averly Ericson, M.D.DATE OF BIRTH:  [birth year]  DATE OF CONSULTATION:  05/24/2010 DATE OF DISCHARGE:                                CONSULTATION   REASON FOR CONSULTATION:  Thrombocytopenia - DVT.  REQUESTING PHYSICIAN:  Lazaro Arms, MD  HISTORY OF PRESENT ILLNESS:  Molly Dickerson is a very pleasant 30 year old [place] woman with recent history of recurrent right lower extremity DVT per [facility] on March 28, 2010, at the popliteal vein and posterior tibial vein with subsequent anticoagulation therapy with heparin per pharmacy, then with Lovenox 70 mg subcu q.12 hours on March 30, 2010, and discontinued with the same medication on that day. The patient had a history of DVT postpregnancy in November 2010, for which she also used Lovenox and coumadinization.  At that time, PTT was 49, PT 13.4, and INR was 1.  The patient was on Lovenox until May 21, 2010, and prior to delivery, was discontinued.  Methadone continued. She was started on Cytotec on admission, and induction took place on May 23, 2010.  Her labor was not complicated, giving birth to a baby girl.  Her platelets prior to delivery were 97,000, then dropping to 76 and to 70 on May 24, 2010.  We were asked to see Molly Dickerson, to determine when to restart Lovenox, and when to Coumadin restart.  It is important to mention that as of March 28, 2010, her platelets were ranging from 135,000 to 122,000 respectively.  We asked for records from [facility], where she had been evaluated at the time of her third delivery, and the management of her DVT took place.  PAST MEDICAL HISTORY: 1. Prior history of right lower extremity DVT, status post Lovenox,     and coumadinization for 6 months. 2. History of  bipolar disorder, diagnosed at age 85, the patient was     on Seroquel and Wellbutrin prior to pregnancy. 3. History of opiate addiction, status post rehab of x3. 4. Depression. 5. History of anemia per records. 6. History of ovarian cyst. 7. History of umbilical hernia.  SURGERIES: 1. Status post lithotripsy on the left. 2. Status post sinus surgery.  ALLERGIES:  NKDA.  MEDICATIONS:  Methadone, Senokot, Dulcolax, Tums, Nupercainal, Benadryl, guaifenesin, MOM, Cepacol, Zofran, Percocet, Sudafed, Mylanta, Fleet enema, Tucks, Ambien.  REVIEW OF SYSTEMS:  The patient has fatigue, she is status post delivery of a healthy baby.  She denies any significant pain in her lower extremities, or significant swelling.  She denies any shortness of breath or pleuritic chest pain.  No fevers, chills, or night sweats.  No headaches.  Of note, the patient was on methadone, who began to use 3 years ago after the second child's birth, after a 5-year addiction to pain medications.  The prior child had born well on methadone.  The patient used Depo-Provera.  Rest of the review of systems is negative.  FAMILY HISTORY:  Mother is alive and  well.  Father, unknown history. The patient has 2 brothers in good health.  SOCIAL HISTORY:  The patient is single.  The father of the baby, Epifanio Lesches, is with her at the time of evaluation.  She has 5 children in total.  Lives in [place], [place].  She is full code.  The patient is unemployed at this time.  Her husband is unemployed as well. She has 2 semesters of college.  She is [employer].  On health maintenance, she smokes 1/2 a pack a day of cigarettes for at least 15 years.  She drinks alcohol as stated by her only in social events, and denies any use of marijuana or cocaine.  As a teenager, she did experiment with this recreational drugs.  Her menarche was at 30 years old.  She states that her periods are regular, and no clots are seen in the  fluid.  PHYSICAL EXAMINATION:  GENERAL:  This is a 30 year old white female, in no acute distress, somewhat lethargic, secondary to medications use during this hospital stay and fatigue postpartum. VITAL SIGNS:  Blood pressure 153/78, pulse is 59, respirations 18, temperature 98.2, O2 sats 98% on room air, weight 65.7 kg, height 60. HEENT:  Normocephalic, atraumatic.  PERRLA.  Oral cavity without thrush or lesions.  She has several missing front teeth. NECK:  Supple.  No cervical or supraclavicular masses. LUNGS:  Clear to auscultation bilaterally.  No axillary masses. CARDIOVASCULAR:  Regular rate and rhythm with a 2/6 systolic murmur in the left sternal border.  No rubs or gallops. ABDOMEN:  Postpartum, palpable suprapubic fundus after delivery, but no tenderness.  Bowel sounds x4.  No hepatosplenomegaly. GU AND RECTAL:  Deferred. EXTREMITIES:  No clubbing or cyanosis.  There is 1-2+ edema, when palpated, there is very mild calf tenderness in both lower extremities. No inguinal masses. SKIN:  Without lesions, bruising, or petechial rash. BREASTS:  Not examined. NEUROLOGIC:  Nonfocal.  LABS:  Hemoglobin 8.3, hematocrit 26.7, white count 8.8, platelets 70, MCV 82.2.  PTT 49 as of March 28, 2010, with PT of 13.4, INR 1.0.  On March 28, 2010, also her CMET showed sodium 135, potassium 4.3, BUN 8, creatinine 0.56, glucose 72.  Total bilirubin 0.2, alkaline phosphatase 103, AST 15, ALT 11, total protein 6.1, albumin 2.3, calcium 8.1.  HIV negative.  ASSESSMENT AND PLAN:  Dr. Darnelle Catalan has seen and evaluated the patient and reviewed the chart.  This is a 30 year old [place] woman, day #3 postpartum, with moderate thrombocytopenia and a history of pregnancy- associated deep vein thrombosis.  Specifically, per the patient's history, her DVT is within 2 months of delivery of her second child, who was born in November 2010.  The patient received Lovenox for 7 days, and then was  taking Coumadin for 6 months, monitored through Dr. Burnett Sheng at [facility].  She also has DVT on the right lower extremity, not in the same leg per the patient's report, on March 28, 2010, requiring heparinization, then Lovenox until delivery, and then Lovenox being resumed and coumadinization pending.  The review of the blood films showed no white blood cells or red blood cells or platelet abnormalities, specifically no schistocytes and no platelet clumps.  Suspect antiphospholipid syndrome.  I have sent hypercoagulable panel.  Expect the patient did have benign thrombocytopenia of pregnancy, and no count shoot, drift up without other intervention and if so, count should drift up without other intervention.  Agree with resumption of Lovenox, has sent a HIT panel  to complete the workup.  Once the cath is removed, preferably, with platelets of 100,000.  The hypercoagulable panel is likely to take several days.  The patient is discharged prior to all the results available.  We will send her and Dr. Burnett Sheng a letter which will also include recommendations for future pregnancy management.  The patient, we felt, can be best monitored in [place] as before.  According, Dr. Darnelle Catalan would not schedule Molly Dickerson for followup with him, but will be glad to see her at any time if the need arises.  Please let us know if we can be of any further help.  Thank you very much for the opportunity to participate in the care of Molly Dickerson.     Marlowe Kays, P.A.   ______________________________ Valentino Hue. Malissia Rabbani, M.D.    SW/MEDQ  D:  05/25/2010  T:  05/25/2010  Job:  [id number]  cc:   [facility]  Electronically Signed by Marlowe Kays P.A. on 05/31/2010 08:14:44 AM Electronically Signed by Ruthann Cancer M.D. on 06/06/2010 10:52:11 AM

## 2010-06-06 NOTE — Discharge Summary (Signed)
NAMEARLINDA, Molly Dickerson              ACCOUNT NO.:  [id number]  MEDICAL RECORD NO.:  [id number]           PATIENT TYPE:  I  LOCATION:  9124                          [facility]ACILITY:  [facility]  PHYSICIAN:  Lazaro Arms, M.D.   DATE OF BIRTH:  Feb 28, 1980  DATE OF ADMISSION:  05/23/2010 DATE OF DISCHARGE:  05/27/2010                              DISCHARGE SUMMARY   DISCHARGE DIAGNOSES:  Labor, chronic pain syndrome, thrombocytopenia, and deep vein thrombosis.  DISCHARGE MEDICATIONS:  Coumadin 5 mg p.o. daily to follow up INR at the high-risk clinic on Monday May 29, 2010.  Lovenox 70 mg subcu q.12 h., methadone 80 mg p.o. daily to be followed at her pain clinic.  CONSULTANTS:  Hem/Oncology with Dr. Burnett Sheng and Dr. Darnelle Catalan.  PROCEDURES:  Epidural catheter placement and epidural catheter removal, spontaneous vaginal delivery.  HOSPITAL COURSE:  Ms. Molly Dickerson is a 30 year old Caucasian female G5, P4-0-0-4 who was admitted to the hospital in labor.  She did not have rupture of membranes.  She had no vaginal bleeding and she had positive fetal activity with contractions.  She is found to be 1 cm, 0%, at +2 station.  She was 39 and 2 weeks.  The chief complaint for admission was induction of labor at 39 weeks.  Her significant past details included smoker of one half pack per day.  She also had a history of right DVT on Lovenox.  She had not been taking her Lovenox. The patient was admitted from MAU to the labor and delivery floor and at 1710, she delivered a viable female by spontaneous vaginal delivery status post induction of labor for DVT with Apgars of 8 and 9 under epidural.  There is no nuchal cord.  The cord was clamped and cut.  Cord blood was sent intact three-vessel cord and placenta delivered spontaneously.  Cervix and vagina inspected.  There was a first-degree laceration not requiring repair with an estimated blood loss 300 mL. The patient was stable and Dr. Debroah Loop was the  attending during the delivery.  The patient was transferred from Labor and Delivery to the regular postpartum floor where she progressed.  She was found to have thrombocytopenia with her initial lab on May 23, 2010, with a platelet count of 97, H and H of 8 and 27.  Her subsequent CBC revealed a platelet count of 76 with H and H of 8 and 26.  Subsequent CBC after that revealed a platelet count of 70.  This was between May 23, 2010, and May 24, 2010.  On May 25, 2010, she had an increase after holding her Lovenox and her platelet count went to 80.  On May 26, 2010, her platelet count had jumped to 94.  During this time, she was evaluated by Hem/Oncology for recommendations and possible hypercoagulable condition. She was found to be positive for lupus anticoagulant and had a low protein C.  She will follow up p.r.n. with the hem/oncologist after discharge.  Her heparin-induced thrombocytopenia platelet antibody was negative.  Her last CBC was 94, platelet count and on May 26, 2010, her epidural was removed once the platelet  count was at a reasonable value for anesthesia to remove the epidural catheter, 12 hours after the catheter was removed her Lovenox was restarted at the treatment dose of 70 mg q.12 h as well as her Coumadin was started.  Her last INR was 0.88 which is subtherapeutic continues to be between 2 and 3.  She will follow up for her INR checks at the high-risk clinic on Monday, and the patient was discharged on May 27, 2010 in improved condition with only complaints of minimal back pain which had improved after the epidural was removed.  Her discharge condition has improved.  Her discharge disposition is home.  Her follow up is with high-risk clinic on Monday. The patient voiced agreement an understanding of the treatment plan. Understood all the diagnostic occurrences and understood the need for anticoagulation including Lovenox and Coumadin with INR checks starting  Monday.  She understood how to administer Lovenox she understood how the Coumadin worked and why she needed to have both of those medications.  She also understood that she would be restarted on her methadone treatment at 80 mg daily and that she was not to take any other narcotics.    ______________________________ Edd Arbour, MD   ______________________________ Lazaro Arms, M.D.    JO/MEDQ  D:  05/27/2010  T:  05/28/2010  Job:  [id number]  Electronically Signed by Edd Arbour MD on 05/30/2010 09:45:33 AM Electronically Signed by Duane Lope M.D. on 06/06/2010 10:05:10 AM

## 2010-06-07 ENCOUNTER — Other Ambulatory Visit: Payer: Medicaid Other

## 2010-06-10 ENCOUNTER — Other Ambulatory Visit: Payer: Medicaid Other

## 2010-06-10 DIAGNOSIS — Z0189 Encounter for other specified special examinations: Secondary | ICD-10-CM

## 2010-06-20 ENCOUNTER — Ambulatory Visit: Payer: Medicaid Other | Admitting: Obstetrics & Gynecology

## 2010-06-30 ENCOUNTER — Ambulatory Visit: Payer: Medicaid Other | Admitting: Physician Assistant

## 2010-07-01 NOTE — Group Therapy Note (Signed)
NAMEADRIEL, Molly Dickerson              ACCOUNT NO.:  [id number]  MEDICAL RECORD NO.:  [id number]           PATIENT TYPE:  A  LOCATION:  [facility]                   FACILITY:  [facility]  PHYSICIAN:  Maylon Cos, CNM    DATE OF BIRTH:  [birth year]  DATE OF SERVICE:  06/30/2010                                 CLINIC NOTE  REASON FOR TODAY'S VISIT:  A 6-week postpartum exam.  HISTORY OF PRESENT ILLNESS:  The patient is a 30 year old gravida 5, para 5-0-0-5, who presents approximately 6 weeks status post spontaneous vaginal delivery at 39 weeks and 2 days, attended by Molly Dickerson, certified nurse midwife at [facility].  She delivered a female infant weighing 7 pounds 14 ounces under epidural anesthesia with a small first-degree perineal laceration, Apgars were 8 and 9.  Molly Dickerson's prenatal course was complicated by: 1. 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  She was on Lovenox     throughout the course of her pregnancy. 2. Substance abuse and was on methadone throughout the course of her     pregnancy and remains on it today. 3. Depression. 4. Smoker. 5. Bipolar disease.  She returns today, stating that she is doing very     well.  She has been suffering from what she feels to be worsening     of depression; however, she was quickly seen by the psychiatrist     and counselor [facility] and has been seen regularly.  Shortly after her     delivery, she was started on Wellbutrin 300 mg daily and Seroquel.     She only took her Seroquel a few times and stopped it.  She does     continue to take her Wellbutrin which she has been on between 3 and     4 weeks.  She is starting to see the benefits of this.  She has an     appointment with her counselor weekly and has a followup     appointment with her psychiatrist next week at [facility].  She does     report that she is still continuing to have brown spotting since     her vaginal delivery.  She has not resumed intercourse.  She is     undecided  about her form of birth control, but she is strongly     leaning towards a bilateral tubal ligation and does desire to sign     tubal ligation papers today and return as soon as possible for a     surgical consult.  She is bottle feeding solely and having no     problems with leaking or breast tenderness.  She says that she is     adequately hydrating, sleeping well and eating well and that she     has adequate support.  She is currently taking Coumadin, for which,     she will remain for the next 6 months secondary to her history of     DVT in pregnancy.  She is unsure of her current Coumadin regimen.     Her last level was taken 1 week ago and is being currently managed  by Dr. Burnett Dickerson at the [facility] in [place] and those records are     not available at present.  PHYSICAL EXAMINATION:  GENERAL:  Molly Dickerson is a 30 year old Caucasian female in no apparent distress.  She does have a flat affect.  She is lethargic in appearance, likely related to her methadone use, but is appropriate to my questioning during our examination. HEENT:  She has poor dentition.  She is missing many of her front teeth. BREASTS:  Soft and nontender. ABDOMEN:  Soft and nontender. NEUROLOGIC:  Cranial nerves are grossly intact.  ASSESSMENT: 1. Six weeks status post spontaneous vaginal delivery of female     infant, doing well. 2. Bottle feeding. 3. Contraceptive management. 4. Long-term anticoagulation therapy related to deep venous thrombosis     in pregnancy. 5. 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 6. Depression.  PLAN: 1. The patient will sign bilateral tubal ligation papers with nurse     today and follow up as soon as possible for a surgical consult to     schedule tubal ligation. 2. The patient should follow up as scheduled with Dr. Burnett Dickerson at     [facility] to continue Coumadin management. 3. The patient should follow up at [facility] as scheduled and continue     methadone  prescription and counseling as prescribed. 4. The patient should also continue counseling and medication regimen     for depression.          ______________________________ Maylon Cos, CNM    SS/MEDQ  D:  06/30/2010  T:  07/01/2010  Job:  [id number]

## 2010-08-04 ENCOUNTER — Encounter: Payer: Medicaid Other | Admitting: Oncology

## 2010-08-17 ENCOUNTER — Ambulatory Visit (INDEPENDENT_AMBULATORY_CARE_PROVIDER_SITE_OTHER): Payer: Medicaid Other | Admitting: Obstetrics & Gynecology

## 2010-08-17 DIAGNOSIS — Z302 Encounter for sterilization: Secondary | ICD-10-CM

## 2010-08-17 DIAGNOSIS — Z01818 Encounter for other preprocedural examination: Secondary | ICD-10-CM

## 2010-08-18 NOTE — Group Therapy Note (Signed)
NAMEGERAL, Molly Dickerson              ACCOUNT NO.:  [id number]  MEDICAL RECORD NO.:  [id number]           PATIENT TYPE:  A  LOCATION:  [facility]                   FACILITY:  [facility]  PHYSICIAN:  Jaynie Collins, MD     DATE OF BIRTH:  1980/10/28  DATE OF SERVICE:  08/17/2010                                 CLINIC NOTE  REASON FOR VISIT:  Consultation for bilateral tubal sterilization.  The patient is a 30 year old gravida 5, para 5-0-0-5 who is here today for consultation for bilateral tubal sterilization.  The patient was followed for her recent pregnancy and her prenatal course was complicated by a history of DVT and also DVT in pregnancy for which she was on Lovenox throughout the course of her pregnancy.  She also has a history of substance abuse and was on methadone throughout her pregnancy.  She also has a history of bipolar disease, which is being treated by bupropion and depression and she is also a smoker.  The patient was seen for 6-week postpartum check on Jun 30, 2010, and at that point she expressed her desire for bilateral tubal sterilization and she signed Medicaid papers at that visit.  Today, she has no other gynecologic concerns.  PAST MEDICAL HISTORY: 1. History of DVT. 2. Substance abuse on methadone. 3. Depression. 4. Bipolar disease.  PAST SURGICAL HISTORY:  The patient has had a history of a diagnostic laparoscopy.  She has also had a history of nephrolithiasis and urethral stents.  She denies any abdominal surgeries.  MEDICATIONS: 1. Methadone 65 mg daily. 2. Warfarin 2.5 mg p.o. every other day. 3. Bupropion 300 mg in the morning and 150 in the p.m.  ALLERGIES:  No known drug allergies.  The patient is not allergic to latex.  SOCIAL HISTORY:  As above.  She denies any other illicit drugs.  FAMILY HISTORY:  Noncontributory.  PHYSICAL EXAMINATION:  VITAL SIGNS:  Temperature is 98.1, pulse 72, blood pressure 108/68, weight 121.2 pounds, height 4 feet  and 9-1/2 inches. GENERAL:  No apparent distress. ABDOMEN:  Soft, nontender, nondistended. EXTREMITIES:  No cyanosis, clubbing, or edema.  ASSESSMENT/PLAN:  This is a 29 year old gravida 5, para 5 who is here today for consultation for BTL.  The patient does have a complicated medical history and is on Coumadin currently for history of a DVT during pregnancy.  The patient is also on methadone.  She was counseled regarding risk of bilateral tubal sterilization and the procedure was discussed in detail.  The risks that were emphasized included bleeding, infection, injury to surrounding organs, need for additional procedure, risk of failure 1/100, increased risk of ectopic pregnancy if failure does occur.  She verbalized understanding of all these risks.  The patient will be contacted by our Anesthesia Team and preoperative nursing regarding if she needs to stop her Coumadin prior to this minor surgery and also what would need to be done regarding her methadone. She was told to expect to be contacted by the surgical team regarding the time and date of surgery.  In the meantime, the patient is practicing abstinence for contraception.  ______________________________ Jaynie Collins, MD    UA/MEDQ  D:  08/17/2010  T:  08/18/2010  Job:  [id number]

## 2010-09-23 ENCOUNTER — Encounter (HOSPITAL_COMMUNITY)
Admission: RE | Admit: 2010-09-23 | Discharge: 2010-09-23 | Disposition: A | Discharge status: Home / Self Care | Payer: Medicaid Other | Source: Ambulatory Visit | Attending: Obstetrics & Gynecology | Admitting: Obstetrics & Gynecology

## 2010-09-23 ENCOUNTER — Encounter (HOSPITAL_COMMUNITY): Payer: Self-pay

## 2010-09-23 HISTORY — DX: Chronic kidney disease, unspecified: N18.9

## 2010-09-23 HISTORY — DX: Depression, unspecified: F32.A

## 2010-09-23 HISTORY — DX: Major depressive disorder, single episode, unspecified: F32.9

## 2010-09-23 HISTORY — DX: Other psychoactive substance dependence, uncomplicated: F19.20

## 2010-09-23 HISTORY — DX: Anxiety disorder, unspecified: F41.9

## 2010-09-23 HISTORY — DX: Calculus of kidney: N20.0

## 2010-09-23 HISTORY — DX: Acute embolism and thrombosis of unspecified deep veins of unspecified lower extremity: I82.409

## 2010-09-23 LAB — CBC
HCT: 36.5 % (ref 36.0–46.0)
Hemoglobin: 11.9 g/dL — ABNORMAL LOW (ref 12.0–15.0)
MCH: 26.4 pg (ref 26.0–34.0)
MCV: 80.9 fL (ref 78.0–100.0)
Platelets: 218 10*3/uL (ref 150–400)
RBC: 4.51 MIL/uL (ref 3.87–5.11)
WBC: 5.8 10*3/uL (ref 4.0–10.5)

## 2010-09-23 LAB — PROTIME-INR: Prothrombin Time: 19 seconds — ABNORMAL HIGH (ref 11.6–15.2)

## 2010-09-23 NOTE — Patient Instructions (Signed)
20 Marciana Burnham  09/23/2010   Your procedure is scheduled on:  09/26/10  Report to [facility] at 1:30 PM.  Call this number if you have problems the morning of surgery: [phone]   Remember:   Do not eat food:After Midnight.  Do not drink clear liquids: 4 Hours before arrival.  Take these medicines the morning of surgery with A SIP OF WATER: per anesthesia   Do not wear jewelry, make-up or nail polish.  Do not wear lotions, powders, or perfumes. You may wear deodorant.  Do not shave 48 hours prior to surgery.  Do not bring valuables to the hospital.  Contacts, dentures or bridgework may not be worn into surgery.  Leave suitcase in the car. After surgery it may be brought to your room.  For patients admitted to the hospital, checkout time is 11:00 AM the day of discharge.   Patients discharged the day of surgery will not be allowed to drive home.  Name and phone number of your driver: Waynetta Sandy- [phone]  Special Instructions: N/A   Please read over the following fact sheets that you were given:

## 2010-09-26 ENCOUNTER — Ambulatory Visit (HOSPITAL_COMMUNITY): Payer: Medicaid Other

## 2010-09-26 ENCOUNTER — Ambulatory Visit (HOSPITAL_COMMUNITY)
Admission: RE | Admit: 2010-09-26 | Discharge: 2010-09-26 | Disposition: A | Discharge status: Home / Self Care | Payer: Medicaid Other | Source: Ambulatory Visit | Attending: Obstetrics & Gynecology | Admitting: Obstetrics & Gynecology

## 2010-09-26 ENCOUNTER — Encounter (HOSPITAL_COMMUNITY)
Admission: RE | Disposition: A | Discharge status: Home / Self Care | Payer: Self-pay | Source: Ambulatory Visit | Attending: Obstetrics & Gynecology

## 2010-09-26 ENCOUNTER — Encounter (HOSPITAL_COMMUNITY): Payer: Self-pay | Admitting: Obstetrics & Gynecology

## 2010-09-26 ENCOUNTER — Encounter (HOSPITAL_COMMUNITY): Payer: Self-pay

## 2010-09-26 DIAGNOSIS — Z302 Encounter for sterilization: Secondary | ICD-10-CM | POA: Insufficient documentation

## 2010-09-26 DIAGNOSIS — Z01812 Encounter for preprocedural laboratory examination: Secondary | ICD-10-CM | POA: Insufficient documentation

## 2010-09-26 DIAGNOSIS — Z01818 Encounter for other preprocedural examination: Secondary | ICD-10-CM | POA: Insufficient documentation

## 2010-09-26 HISTORY — PX: LAPAROSCOPIC TUBAL LIGATION: SHX1937

## 2010-09-26 LAB — PREGNANCY, URINE: Preg Test, Ur: NEGATIVE

## 2010-09-26 SURGERY — LIGATION, FALLOPIAN TUBE, LAPAROSCOPIC
Anesthesia: General | Laterality: Bilateral

## 2010-09-26 MED ORDER — LIDOCAINE HCL (CARDIAC) 20 MG/ML IV SOLN
INTRAVENOUS | Status: DC | PRN
  Administered 2010-09-26: 50 mg via INTRAVENOUS

## 2010-09-26 MED ORDER — DEXAMETHASONE SODIUM PHOSPHATE 10 MG/ML IJ SOLN
INTRAMUSCULAR | Status: DC | PRN
  Administered 2010-09-26: 10 mg via INTRAVENOUS

## 2010-09-26 MED ORDER — KETOROLAC TROMETHAMINE 30 MG/ML IJ SOLN: 15.0000 mg | Freq: Once | INTRAMUSCULAR | Status: DC | PRN

## 2010-09-26 MED ORDER — ACETAMINOPHEN 325 MG PO TABS: 325.0000 mg | ORAL_TABLET | ORAL | Status: DC | PRN

## 2010-09-26 MED ORDER — GLYCOPYRROLATE 0.2 MG/ML IJ SOLN
INTRAMUSCULAR | Status: DC | PRN
  Administered 2010-09-26: .8 mg via INTRAVENOUS

## 2010-09-26 MED ORDER — ONDANSETRON HCL 4 MG/2ML IJ SOLN
INTRAMUSCULAR | Status: AC
  Filled 2010-09-26: qty 2

## 2010-09-26 MED ORDER — LIDOCAINE HCL (CARDIAC) 20 MG/ML IV SOLN
INTRAVENOUS | Status: AC
  Filled 2010-09-26: qty 5

## 2010-09-26 MED ORDER — PROMETHAZINE HCL 25 MG/ML IJ SOLN: 6.2500 mg | INTRAMUSCULAR | Status: DC | PRN

## 2010-09-26 MED ORDER — DEXAMETHASONE SODIUM PHOSPHATE 10 MG/ML IJ SOLN
INTRAMUSCULAR | Status: AC
  Filled 2010-09-26: qty 1

## 2010-09-26 MED ORDER — PROPOFOL 10 MG/ML IV EMUL
INTRAVENOUS | Status: DC | PRN
  Administered 2010-09-26: 180 mg via INTRAVENOUS

## 2010-09-26 MED ORDER — NEOSTIGMINE METHYLSULFATE 1 MG/ML IJ SOLN
INTRAMUSCULAR | Status: DC | PRN
  Administered 2010-09-26: 5 mg via INTRAMUSCULAR

## 2010-09-26 MED ORDER — BUPIVACAINE HCL (PF) 0.25 % IJ SOLN
INTRAMUSCULAR | Status: DC | PRN
  Administered 2010-09-26: 10 mL

## 2010-09-26 MED ORDER — ROCURONIUM BROMIDE 50 MG/5ML IV SOLN
INTRAVENOUS | Status: AC
  Filled 2010-09-26: qty 1

## 2010-09-26 MED ORDER — FAMOTIDINE 20 MG PO TABS: 20.0000 mg | ORAL_TABLET | Freq: Once | ORAL | Status: DC | PRN

## 2010-09-26 MED ORDER — SCOPOLAMINE 1 MG/3DAYS TD PT72: 1.0000 | MEDICATED_PATCH | Freq: Once | TRANSDERMAL | Status: DC | PRN

## 2010-09-26 MED ORDER — MUPIROCIN 2 % EX OINT
TOPICAL_OINTMENT | Freq: Two times a day (BID) | CUTANEOUS | Status: DC
  Administered 2010-09-26: 14:00:00 via NASAL

## 2010-09-26 MED ORDER — KETOROLAC TROMETHAMINE 30 MG/ML IJ SOLN
INTRAMUSCULAR | Status: AC
  Filled 2010-09-26: qty 1

## 2010-09-26 MED ORDER — PROPOFOL 10 MG/ML IV EMUL
INTRAVENOUS | Status: AC
  Filled 2010-09-26: qty 20

## 2010-09-26 MED ORDER — MIDAZOLAM HCL 5 MG/5ML IJ SOLN
INTRAMUSCULAR | Status: DC | PRN
  Administered 2010-09-26: 2 mg via INTRAVENOUS

## 2010-09-26 MED ORDER — NEOSTIGMINE METHYLSULFATE 1 MG/ML IJ SOLN
INTRAMUSCULAR | Status: AC
  Filled 2010-09-26: qty 10

## 2010-09-26 MED ORDER — MIDAZOLAM HCL 2 MG/2ML IJ SOLN
INTRAMUSCULAR | Status: AC
  Filled 2010-09-26: qty 2

## 2010-09-26 MED ORDER — OXYCODONE-ACETAMINOPHEN 5-325 MG PO TABS
1.0000 | ORAL_TABLET | ORAL | Status: DC | PRN
  Administered 2010-09-26: 1 via ORAL

## 2010-09-26 MED ORDER — ROCURONIUM BROMIDE 100 MG/10ML IV SOLN
INTRAVENOUS | Status: DC | PRN
  Administered 2010-09-26: 30 mg via INTRAVENOUS

## 2010-09-26 MED ORDER — DOCUSATE SODIUM 100 MG PO CAPS: 100.0000 mg | ORAL_CAPSULE | Freq: Two times a day (BID) | ORAL | Status: AC | PRN

## 2010-09-26 MED ORDER — FENTANYL CITRATE 0.05 MG/ML IJ SOLN
INTRAMUSCULAR | Status: AC
  Filled 2010-09-26: qty 5

## 2010-09-26 MED ORDER — CITRIC ACID-SODIUM CITRATE 334-500 MG/5ML PO SOLN: 30.0000 mL | Freq: Once | ORAL | Status: DC | PRN

## 2010-09-26 MED ORDER — OXYCODONE-ACETAMINOPHEN 5-325 MG PO TABS: 1.0000 | ORAL_TABLET | ORAL | Status: AC | PRN

## 2010-09-26 MED ORDER — PANTOPRAZOLE SODIUM 40 MG PO TBEC: 40.0000 mg | DELAYED_RELEASE_TABLET | Freq: Once | ORAL | Status: DC | PRN

## 2010-09-26 MED ORDER — FENTANYL CITRATE 0.05 MG/ML IJ SOLN: 25.0000 ug | INTRAMUSCULAR | Status: DC | PRN

## 2010-09-26 MED ORDER — GLYCOPYRROLATE 0.2 MG/ML IJ SOLN
INTRAMUSCULAR | Status: AC
  Filled 2010-09-26: qty 2

## 2010-09-26 MED ORDER — ONDANSETRON HCL 4 MG/2ML IJ SOLN
INTRAMUSCULAR | Status: DC | PRN
  Administered 2010-09-26: 4 mg via INTRAVENOUS

## 2010-09-26 MED ORDER — HYDROMORPHONE HCL 1 MG/ML IJ SOLN
0.5000 mg | INTRAMUSCULAR | Status: DC | PRN
  Administered 2010-09-26: 0.5 mg via INTRAVENOUS

## 2010-09-26 MED ORDER — METOCLOPRAMIDE HCL 10 MG PO TABS: 10.0000 mg | ORAL_TABLET | Freq: Once | ORAL | Status: DC | PRN

## 2010-09-26 MED ORDER — LACTATED RINGERS IV SOLN
INTRAVENOUS | Status: DC
  Administered 2010-09-26 (×2): via INTRAVENOUS

## 2010-09-26 SURGICAL SUPPLY — 17 items
CATH ROBINSON RED A/P 16FR (CATHETERS) ×2 IMPLANT
CHLORAPREP W/TINT 26ML (MISCELLANEOUS) ×2 IMPLANT
CLIP FILSHIE TUBAL LIGA STRL (Clip) ×2 IMPLANT
CLOTH BEACON ORANGE TIMEOUT ST (SAFETY) ×2 IMPLANT
DRSG COVADERM PLUS 2X2 (GAUZE/BANDAGES/DRESSINGS) ×2 IMPLANT
GLOVE BIO SURGEON STRL SZ7 (GLOVE) ×2 IMPLANT
GLOVE BIOGEL PI IND STRL 7.0 (GLOVE) ×2 IMPLANT
GLOVE BIOGEL PI INDICATOR 7.0 (GLOVE) ×2
GOWN PREVENTION PLUS LG XLONG (DISPOSABLE) ×4 IMPLANT
GOWN STRL REIN XL XLG (GOWN DISPOSABLE) ×2 IMPLANT
NEEDLE INSUFFLATION 14GA 120MM (NEEDLE) ×2 IMPLANT
PACK LAPAROSCOPY BASIN (CUSTOM PROCEDURE TRAY) ×2 IMPLANT
SUT VIC AB 3-0 X1 27 (SUTURE) ×2 IMPLANT
SUT VICRYL 0 UR6 27IN ABS (SUTURE) ×4 IMPLANT
TOWEL OR 17X24 6PK STRL BLUE (TOWEL DISPOSABLE) ×4 IMPLANT
TROCAR Z-THREAD FIOS 11X100 BL (TROCAR) ×2 IMPLANT
WATER STERILE IRR 1000ML POUR (IV SOLUTION) ×2 IMPLANT

## 2010-09-26 NOTE — Addendum Note (Signed)
Addendum  created 09/26/10 1700 by Velna Hatchet   Modules edited:Orders, PRL Based Order Sets

## 2010-09-26 NOTE — Anesthesia Postprocedure Evaluation (Signed)
  Anesthesia Post Note  Patient: Molly Dickerson  Procedure(s) Performed:  LAPAROSCOPIC TUBAL LIGATION  Anesthesia type: GA  Patient location: PACU  Post pain: Pain level controlled  Post assessment: Post-op Vital signs reviewed  Last Vitals:  Filed Vitals:   09/26/10 1600  BP: 115/72  Pulse: 65  Temp:   Resp: 16    Post vital signs: Reviewed  Level of consciousness: sedated  Complications: No apparent anesthesia complications

## 2010-09-26 NOTE — H&P (Signed)
  REASON FOR SURGERY: Undesired fertility PROPOSED SURGERY: Laparoscopic bilateral tubal sterilization using Filshie clips The patient is a 30 year old gravida 5, para 5-0-0-5 who is here today for bilateral tubal sterilization. The patient was followed for her recent pregnancy and her prenatal course was complicated by a history of DVT and also DVT in pregnancy for which she was on Lovenox throughout the course of her pregnancy. She also has a history of substance abuse and was on methadone throughout her pregnancy. She also has a history of bipolar disease, which is being treated by bupropion and depression and she is also a smoker. The patient was seen for 6-week postpartum check on Jun 30, 2010, and at that point she expressed her desire for bilateral tubal sterilization and she signed Medicaid papers at that visit. Today, she has no other gynecologic concerns.  PAST MEDICAL HISTORY:  1. History of DVT.  2. Substance abuse on methadone.  3. Depression.  4. Bipolar disease.  PAST SURGICAL HISTORY: The patient has had a history of a diagnostic laparoscopy. She has also had a history of nephrolithiasis and urethral stents. She denies any abdominal surgeries.  MEDICATIONS:  1. Methadone 65 mg daily.  2. Warfarin 2.5 mg p.o. every other day.  3. Bupropion 300 mg in the morning and 150 in the p.m.  ALLERGIES: No known drug allergies. The patient is not allergic to latex.  SOCIAL HISTORY: As above. She denies any other illicit drugs.  FAMILY HISTORY: Noncontributory.  PHYSICAL EXAMINATION:  VITAL SIGNS: Temperature is 98.51F, pulse 81, blood pressure 97/65, respiration 18  GENERAL: No apparent distress.  LUNGS: CTAB HEART: RRR ABDOMEN: Soft, nontender, nondistended.  PELVIC: Deferred EXTREMITIES: No cyanosis, clubbing, or edema.   ASSESSMENT/PLAN: 30 year old gravida 5, para 5 who is here today for BTL. The patient does have a complicated medical history and is on Coumadin currently for history  of a DVT during pregnancy. The patient is also on methadone. She was counseled regarding risk of bilateral tubal sterilization and the procedure was discussed in detail. The risks that were emphasized included bleeding, infection, injury to surrounding organs, need for additional procedures, risk of failure 1/100, increased risk of ectopic pregnancy if failure does occur. To OR when ready.   Geordan Xu A 09/26/2010

## 2010-09-26 NOTE — Anesthesia Preprocedure Evaluation (Deleted)
Anesthesia Evaluation  General Assessment Comment  Airway       Dental   Pulmonary      Cardiovascular     Neuro/Psych   GI/Hepatic/Renal   Endo/Other    Abdominal   Musculoskeletal   Hematology   Peds  Reproductive/Obstetrics    Anesthesia Other Findings             Anesthesia Physical Anesthesia Plan Anesthesia Quick Evaluation  

## 2010-09-26 NOTE — Progress Notes (Signed)
Pt was in restroom for approx. 30 minutes.  RN continually was checking on her..the patient states that she had to keep voiding.  No distress noted at this time.

## 2010-09-26 NOTE — OR Nursing (Signed)
No foley placed, in and out catheter placed per MD after vagina prep. Was unable to delete the foley insertion

## 2010-09-26 NOTE — Transfer of Care (Signed)
Immediate Anesthesia Transfer of Care Note  Patient: Molly Dickerson  Procedure(s) Performed:  LAPAROSCOPIC TUBAL LIGATION  Patient Location: PACU  Anesthesia Type: General  Level of Consciousness: awake, alert  and oriented  Airway & Oxygen Therapy: Patient Spontanous Breathing and Patient connected to nasal cannula oxygen  Post-op Assessment: Report given to PACU RN and Post -op Vital signs reviewed and stable  Post vital signs: Reviewed and stable  Complications: No apparent anesthesia complications

## 2010-09-26 NOTE — Anesthesia Preprocedure Evaluation (Deleted)
Anesthesia Evaluation   Patient awake and Patient confused  General Assessment Comment  History of Anesthesia Complications Negative for: history of anesthetic complications  Airway Mallampati: II  Neck ROM: Full    Dental No notable dental hx. (+) Teeth Intact   Pulmonary  clear to auscultation  pulmonary exam normal   Cardiovascular Regular Normal    Neuro/Psych   GI/Hepatic/Renal negative Liver ROS, and negative Renal ROS (+) hiatal hernia,  GERD      Endo/Other    Abdominal Normal abdominal exam  (+)  Abdomen: soft. Bowel sounds: normal.  Musculoskeletal   Hematology   Peds  Reproductive/Obstetrics    Anesthesia Other Findings             Anesthesia Physical Anesthesia Plan  ASA: II  Anesthesia Plan:    Post-op Pain Management:    Induction:   Airway Management Planned:   Additional Equipment:   Intra-op Plan:   Post-operative Plan:   Informed Consent:   Plan Discussed with:   Anesthesia Plan Comments:         Anesthesia Quick Evaluation

## 2010-09-26 NOTE — Op Note (Signed)
Farrie Wedin 09/26/2010  PREOPERATIVE DIAGNOSIS:  Undesired fertility  POSTOPERATIVE DIAGNOSIS:  Undesired fertility  PROCEDURE:  Laparoscopic Bilateral Tubal Sterilization using Filshie Clips  ANESTHESIA:  General endotracheal  COMPLICATIONS:  None immediate.  ESTIMATED BLOOD LOSS:  Less than 20cc.  FLUIDS: 1000 cc LR.  INDICATIONS: 30 y.o.  [id number] with undesired fertility, desires permanent sterilization. Risks and benefits of procedure discussed with patient including permanence of method, bleeding, infection, injury to surrounding organs and need for additional procedures including laparotomy. Risk failure of 0.5-1% with increased risk of ectopic gestation if pregnancy occurs was also discussed with patient.   FINDINGS:  Normal uterus, tubes, and ovaries.  TECHNIQUE:  The patient was taken to the operating room where general anesthesia was obtained without difficulty.  She was then placed in the dorsal lithotomy position and prepared and draped in sterile fashion.  After an adequate timeout was performed, a bivalved speculum was then placed in the patient's vagina, and the anterior lip of cervix grasped with the single-tooth tenaculum.  The uterine manipulator was then advanced into the uterus.  The speculum was removed from the vagina. Attention was then turned to the patient's abdomen where a 10-mm skin incision was made on the umbilical fold.  The Veress needle was carefully introduced into the peritoneal cavity through the abdominal wall.  Intraperitoneal placement was confirmed by drop in intraabdominal pressure with insufflation of carbon dioxide gas.  Adequate pneumoperitoneum was obtained, and the 10-mm trocar and sleeve were then advanced without difficulty into the abdomen where intraabdominal placement was confirmed by the operative laparoscope. A survey of the patient's pelvis and abdomen revealed entirely normal anatomy.  The fallopian tubes were observed and found to be normal  in appearance. The Filshie clip applicator was placed through the operative port, and a Filshie clip was placed on the right fallopian tube ,about 2 cm from the cornual attachment, with care given to incorporate the underlying mesosalpinx.  A similar process was carried out on the contralateral side allowing for bilateral tubal sterilization.  Good hemostasis was noted overall.  Local analgesia was drizzled on both operative sites.The instruments were then removed from the patient's abdomen and the fascial incision was repaired with 0 Vicryl, and the skin was closed with Dermabond.  The uterine manipulator and the tenaculum were removed from the vagina without complications. The patient tolerated the procedure well.  Sponge, lap, and needle counts were correct times two.  The patient was then taken to the recovery room awake, extubated and in stable  in stable condition  Ahmed Inniss A 09/26/2010 3:30 PM

## 2010-09-26 NOTE — Anesthesia Preprocedure Evaluation (Signed)
Anesthesia Evaluation  Name, MR# and DOB Patient awake  General Assessment Comment  Reviewed: Allergy & Precautions, H&P , Patient's Chart, lab work & pertinent test results and reviewed documented beta blocker date and time   History of Anesthesia Complications Negative for: history of anesthetic complications  Airway Mallampati: II TM Distance: >3 FB Neck ROM: full    Dental No notable dental hx.    Pulmonary  clear to auscultation  pulmonary exam normalPulmonary Exam Normal breath sounds clear to auscultation none    Cardiovascular Exercise Tolerance: Good regular Normal    Neuro/Psych    (+) PSYCHIATRIC DISORDERS, Anxiety, Depression,  Negative Neurological ROS  Negative Psych ROS  GI/Hepatic/Renal negative GI ROS, negative Liver ROS, and negative Renal ROS (+)       Endo/Other  Negative Endocrine ROS (+)      Abdominal   Musculoskeletal   Hematology negative hematology ROS (+)   Peds  Reproductive/Obstetrics negative OB ROS    Anesthesia Other Findings On warfarin for DVTs Chronic methadone for drug use            Anesthesia Physical Anesthesia Plan  ASA: III  Anesthesia Plan: General   Post-op Pain Management:    Induction:   Airway Management Planned:   Additional Equipment:   Intra-op Plan:   Post-operative Plan:   Informed Consent: I have reviewed the patients History and Physical, chart, labs and discussed the procedure including the risks, benefits and alternatives for the proposed anesthesia with the patient or authorized representative who has indicated his/her understanding and acceptance.   Dental Advisory Given  Plan Discussed with: CRNA and Surgeon  Anesthesia Plan Comments:         Anesthesia Quick Evaluation

## 2010-10-25 ENCOUNTER — Encounter (HOSPITAL_COMMUNITY): Payer: Self-pay | Admitting: Obstetrics & Gynecology

## 2010-10-27 ENCOUNTER — Ambulatory Visit: Payer: Medicaid Other | Admitting: Obstetrics and Gynecology

## 2010-10-27 ENCOUNTER — Encounter: Payer: Self-pay | Admitting: Family Medicine

## 2011-12-04 ENCOUNTER — Emergency Department: Payer: Self-pay | Admitting: Emergency Medicine

## 2011-12-04 LAB — BASIC METABOLIC PANEL
Anion Gap: 9 (ref 7–16)
BUN: 14 mg/dL (ref 7–18)
Chloride: 109 mmol/L — ABNORMAL HIGH (ref 98–107)
Co2: 22 mmol/L (ref 21–32)
EGFR (African American): >60
Osmolality: 280 (ref 275–301)

## 2011-12-04 LAB — URINALYSIS, COMPLETE
Ph: 5 (ref 4.5–8.0)
Protein: NEGATIVE
Specific Gravity: 1.026 (ref 1.003–1.030)

## 2011-12-04 LAB — CBC
MCH: 32.4 pg (ref 26.0–34.0)
MCV: 92 fL (ref 80–100)
RDW: 12.5 % (ref 11.5–14.5)

## 2011-12-27 ENCOUNTER — Emergency Department: Payer: Self-pay

## 2011-12-27 LAB — BASIC METABOLIC PANEL
BUN: 16 mg/dL (ref 7–18)
Calcium, Total: 8.8 mg/dL (ref 8.5–10.1)
Chloride: 109 mmol/L — ABNORMAL HIGH (ref 98–107)
Co2: 26 mmol/L (ref 21–32)
Creatinine: 0.84 mg/dL (ref 0.60–1.30)
Osmolality: 287 (ref 275–301)
Sodium: 143 mmol/L (ref 136–145)

## 2011-12-27 LAB — PREGNANCY, URINE: Pregnancy Test, Urine: NEGATIVE m[IU]/mL

## 2011-12-27 LAB — URINALYSIS, COMPLETE
Bilirubin,UR: NEGATIVE
Glucose,UR: NEGATIVE mg/dL (ref 0–75)
Nitrite: NEGATIVE
Ph: 6 (ref 4.5–8.0)
RBC,UR: <1 /HPF (ref 0–5)
Squamous Epithelial: 2

## 2011-12-27 LAB — DRUG SCREEN, URINE
Amphetamines, Ur Screen: NEGATIVE (ref ?–1000)
Cocaine Metabolite,Ur ~~LOC~~: NEGATIVE (ref ?–300)
MDMA (Ecstasy)Ur Screen: NEGATIVE (ref ?–500)
Methadone, Ur Screen: NEGATIVE (ref ?–300)
Opiate, Ur Screen: NEGATIVE (ref ?–300)

## 2011-12-27 LAB — CBC
HGB: 12.5 g/dL (ref 12.0–16.0)
MCH: 32.3 pg (ref 26.0–34.0)
MCV: 92 fL (ref 80–100)
Platelet: 218 10*3/uL (ref 150–440)
RBC: 3.88 10*6/uL (ref 3.80–5.20)

## 2011-12-27 LAB — ETHANOL: Ethanol %: <0.003 % (ref 0.000–0.080)

## 2012-10-19 IMAGING — US US OB FOLLOW-UP
2 series · 12 of 26 positions shown · non-contrast
Comparison: none

[Series 1: us ob follow up · 1 of 2 slices shown (1 of 2)]
[im 2/2]
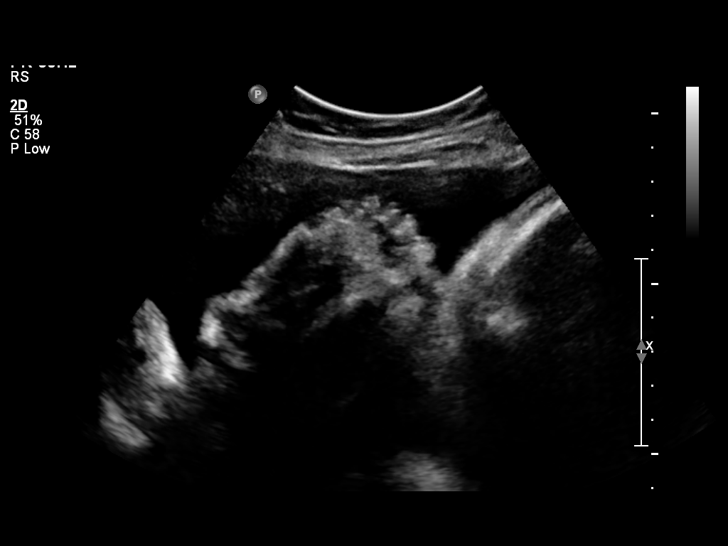

[Series 1: us ob follow up · 11 of 24 slices shown (2 of 2)]
[im 2/24]
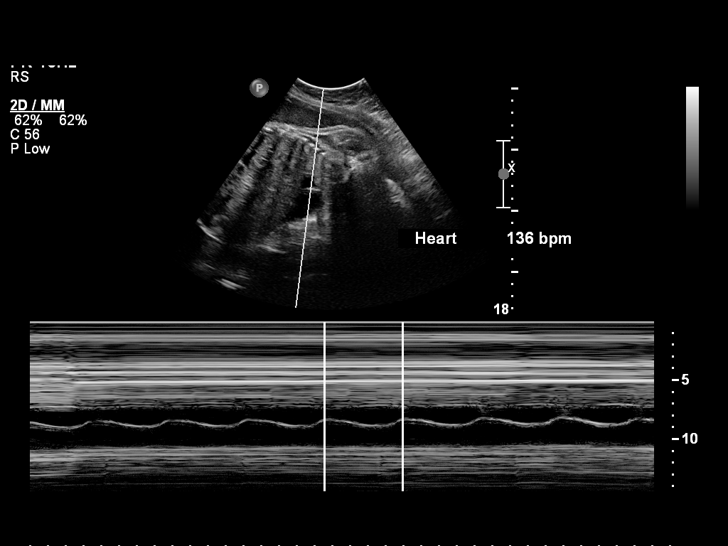
[im 4/24]
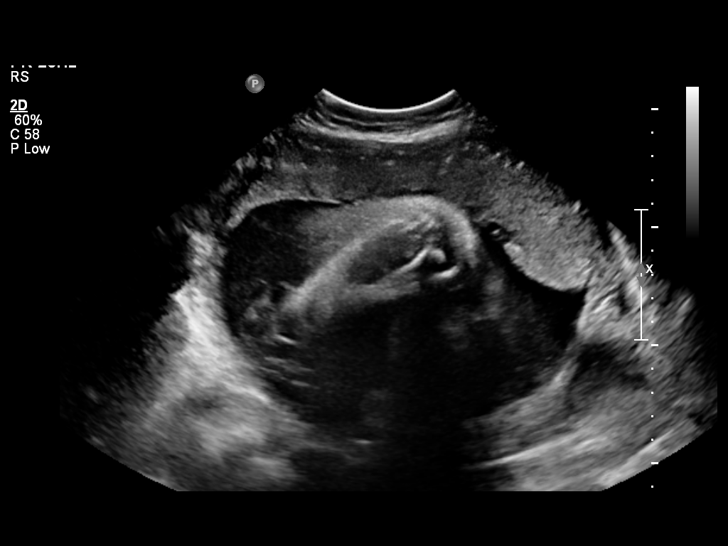
[im 6/24]
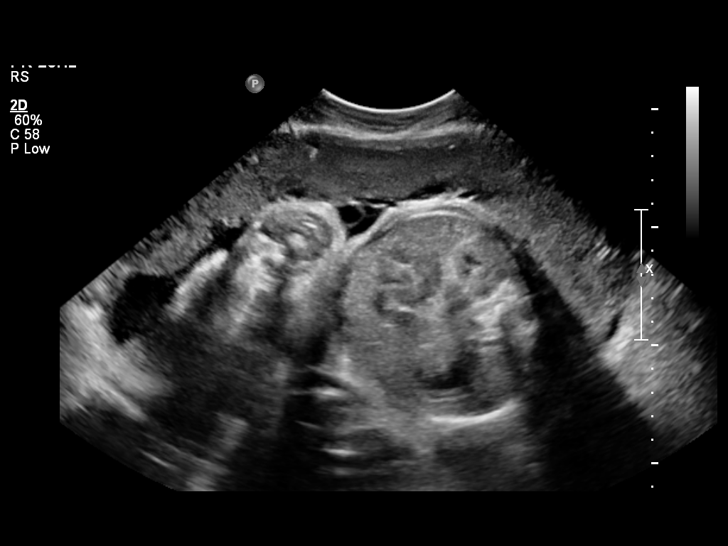
[im 8/24]
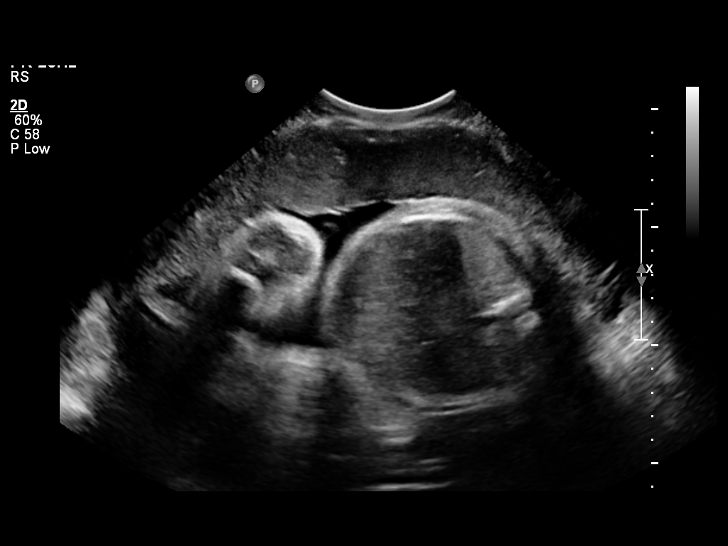
[im 10/24]
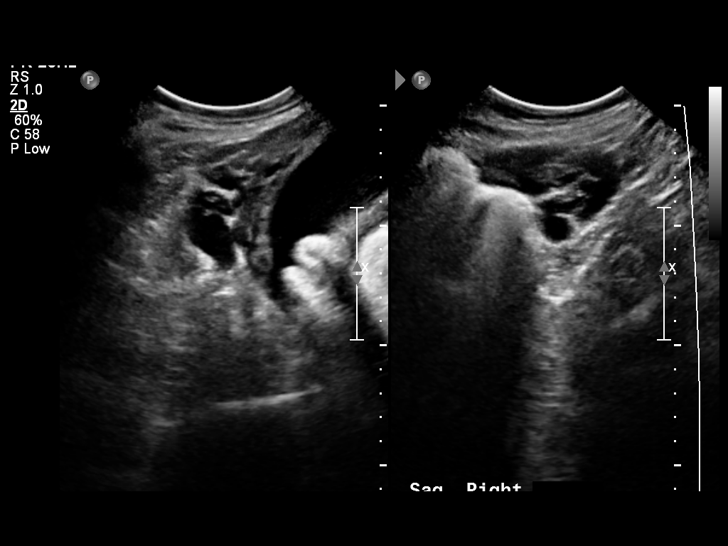
[im 13/24]
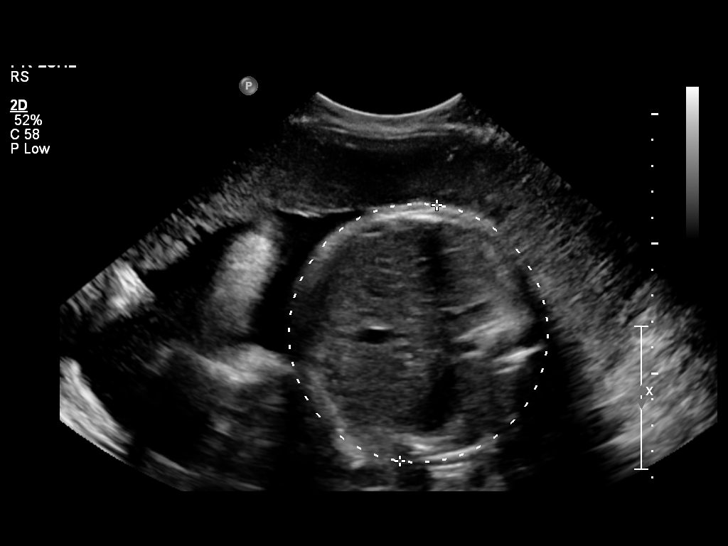
[im 15/24]
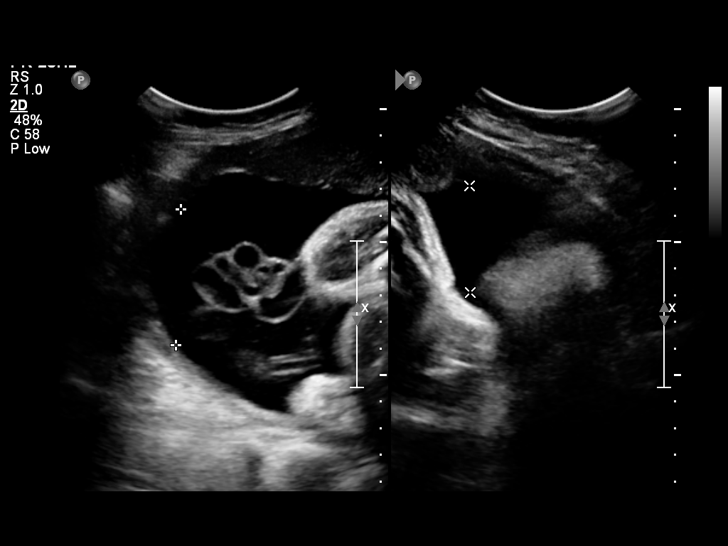
[im 17/24]
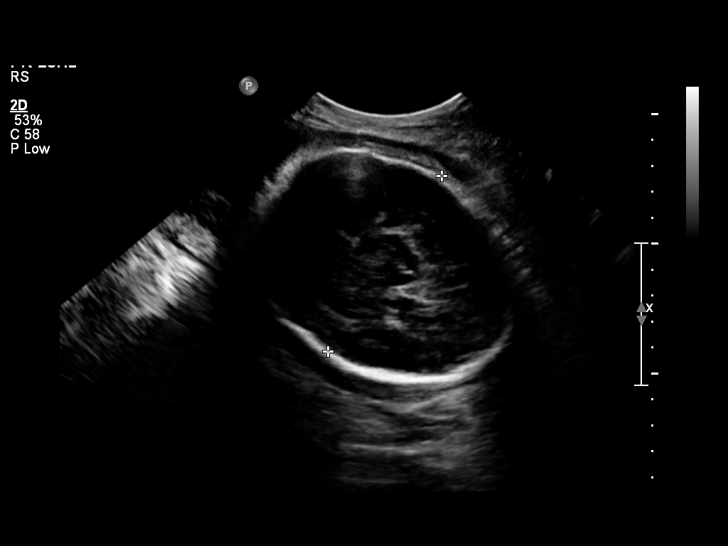
[im 19/24]
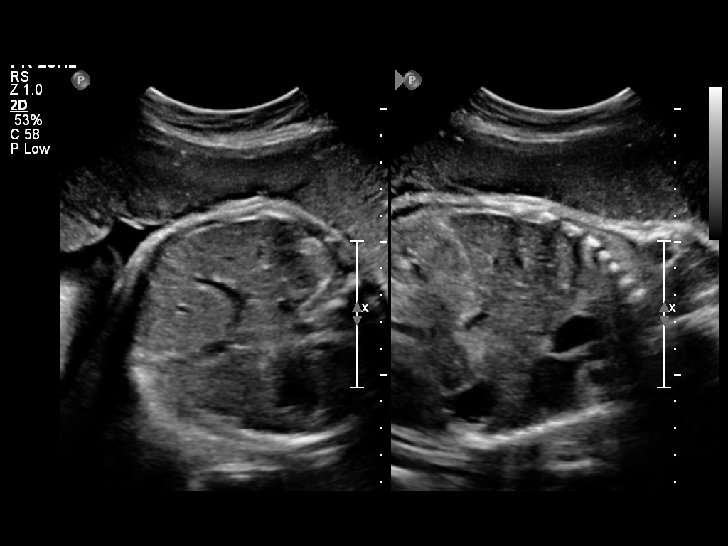
[im 21/24]
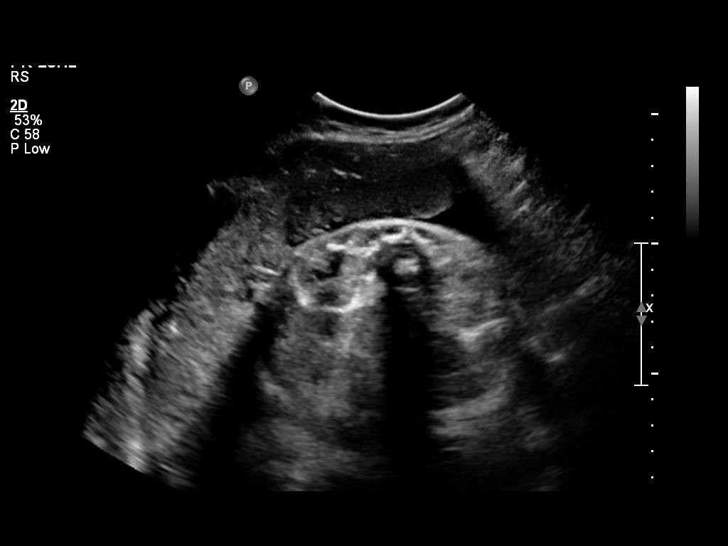
[im 23/24]
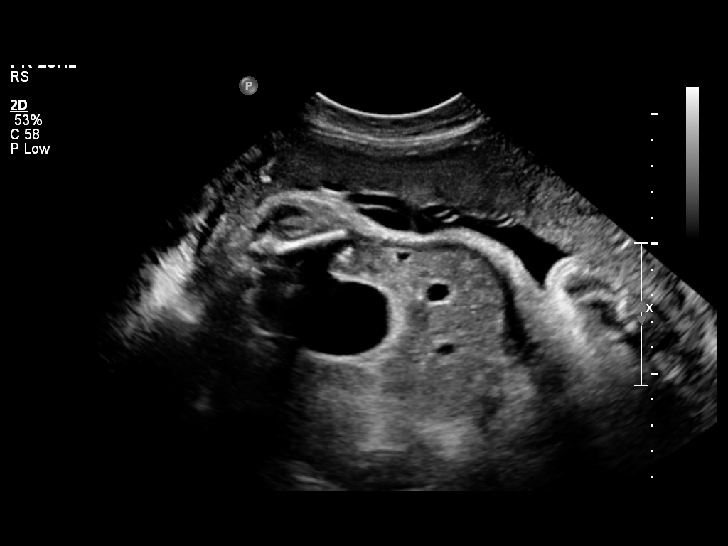

[12 of 26 positions shown; findings below may reference images not displayed]

OBSTETRICS REPORT
                      (Signed Final 04/19/2010 [DATE])

 Order#:         [id number]
Procedures

 US OB FOLLOW UP                                       76816.1
Indications

 Assess Fetal Growth / Estimated Fetal Weight
 History of DVT
 Heroin/Methadone use
 Cigarette smoker
 Conceived on DEPO
 Poor obstetric history (prior pre-term labor)
 Marginal placental cord insertion
Fetal Evaluation

 Fetal Heart Rate:  136                          bpm
 Cardiac Activity:  Observed
 Presentation:      Cephalic
 Placenta:          Anterior, above cervical os
 P. Cord            Marginal Previously
 Insertion:         Visualized

 Amniotic Fluid
 AFI FV:      Subjectively within normal limits
 AFI Sum:     15.37   cm       55  %Tile     Larg Pckt:    5.09  cm
 RUQ:   5.09    cm   RLQ:    4      cm    LUQ:   2.64    cm   LLQ:    3.64   cm
Biometry

 BPD:     80.4  mm     G. Age:  32w 2d                CI:         69.4   70 - 86
                                                      FL/HC:      21.5   19.4 -

 HC:     308.1  mm     G. Age:  34w 3d       17  %    HC/AC:      0.98   0.96 -

 AC:     312.8  mm     G. Age:  35w 1d       76  %    FL/BPD:     82.3   71 - 87
 FL:      66.2  mm     G. Age:  34w 1d       32  %    FL/AC:      21.2   20 - 24

 Est. FW:    3330  gm      5 lb 6 oz     62  %
Gestational Age

 U/S Today:     34w 0d                                        EDD:   05/31/10
 Best:          34w 3d     Det. By:  U/S (12/16/09)           EDD:   05/28/10
Anatomy

 Cranium:           Previously seen     Aortic Arch:       Basic anatomy
                                                           exam per order
 Fetal Cavum:       Previously seen     Ductal Arch:       Basic anatomy
                                                           exam per order
 Ventricles:        Appears normal      Diaphragm:         Basic anatomy
                                                           exam per order
 Choroid Plexus:    Previously seen     Stomach:           Appears
                                                           normal, left
                                                           sided
 Cerebellum:        Previously seen     Abdomen:           Appears normal
 Posterior Fossa:   Previously seen     Abdominal Wall:    Previously seen
 Nuchal Fold:       Previously seen     Cord Vessels:      Previously seen
 Face:              Previously seen     Kidneys:           Appear normal
 Heart:             Previously seen     Bladder:           Appears normal
 RVOT:              Basic anatomy       Spine:             Appears normal
                    exam per order
 LVOT:              Appears normal      Limbs:             Previously seen

 Other:     Technically difficult due to advanced GA and fetal
            position.
Cervix Uterus Adnexa

 Cervix:       Not visualized (advanced GA >34 wks)

 Adnexa:     No abnormality visualized.
Impression

 Assigned GA is currently 34w 3d. Appropriate fetal growth,
 with EFW at 62 %ile.
 Amniotic fluid within normal limits, with AFI of 15.37 cm.

 questions or concerns.

## 2013-12-22 ENCOUNTER — Encounter (HOSPITAL_COMMUNITY): Payer: Self-pay | Admitting: Obstetrics & Gynecology

## 2019-04-01 ENCOUNTER — Other Ambulatory Visit: Payer: Self-pay

## 2019-04-01 ENCOUNTER — Encounter: Payer: Self-pay | Admitting: Emergency Medicine

## 2019-04-01 ENCOUNTER — Emergency Department: Payer: Self-pay

## 2019-04-01 ENCOUNTER — Inpatient Hospital Stay
Admission: EM | Admit: 2019-04-01 | Discharge: 2019-04-04 | DRG: 281 | Disposition: A | Discharge status: Home / Self Care | Payer: Self-pay | Attending: Cardiovascular Disease | Admitting: Cardiovascular Disease

## 2019-04-01 ENCOUNTER — Encounter
Admission: EM | Disposition: A | Discharge status: Home / Self Care | Payer: Self-pay | Source: Home / Self Care | Attending: Internal Medicine

## 2019-04-01 DIAGNOSIS — I2102 ST elevation (STEMI) myocardial infarction involving left anterior descending coronary artery: Principal | ICD-10-CM | POA: Diagnosis present

## 2019-04-01 DIAGNOSIS — F329 Major depressive disorder, single episode, unspecified: Secondary | ICD-10-CM | POA: Diagnosis present

## 2019-04-01 DIAGNOSIS — E785 Hyperlipidemia, unspecified: Secondary | ICD-10-CM | POA: Diagnosis present

## 2019-04-01 DIAGNOSIS — D649 Anemia, unspecified: Secondary | ICD-10-CM | POA: Diagnosis present

## 2019-04-01 DIAGNOSIS — Z86718 Personal history of other venous thrombosis and embolism: Secondary | ICD-10-CM

## 2019-04-01 DIAGNOSIS — Z590 Homelessness: Secondary | ICD-10-CM

## 2019-04-01 DIAGNOSIS — I255 Ischemic cardiomyopathy: Secondary | ICD-10-CM | POA: Diagnosis present

## 2019-04-01 DIAGNOSIS — F1910 Other psychoactive substance abuse, uncomplicated: Secondary | ICD-10-CM | POA: Diagnosis present

## 2019-04-01 DIAGNOSIS — F419 Anxiety disorder, unspecified: Secondary | ICD-10-CM | POA: Diagnosis present

## 2019-04-01 DIAGNOSIS — I2510 Atherosclerotic heart disease of native coronary artery without angina pectoris: Secondary | ICD-10-CM | POA: Diagnosis present

## 2019-04-01 DIAGNOSIS — F17210 Nicotine dependence, cigarettes, uncomplicated: Secondary | ICD-10-CM | POA: Diagnosis present

## 2019-04-01 DIAGNOSIS — D72829 Elevated white blood cell count, unspecified: Secondary | ICD-10-CM | POA: Diagnosis present

## 2019-04-01 DIAGNOSIS — Z720 Tobacco use: Secondary | ICD-10-CM

## 2019-04-01 DIAGNOSIS — N189 Chronic kidney disease, unspecified: Secondary | ICD-10-CM | POA: Diagnosis present

## 2019-04-01 DIAGNOSIS — I213 ST elevation (STEMI) myocardial infarction of unspecified site: Secondary | ICD-10-CM

## 2019-04-01 DIAGNOSIS — O871 Deep phlebothrombosis in the puerperium: Secondary | ICD-10-CM

## 2019-04-01 DIAGNOSIS — Z87442 Personal history of urinary calculi: Secondary | ICD-10-CM

## 2019-04-01 DIAGNOSIS — F1120 Opioid dependence, uncomplicated: Secondary | ICD-10-CM | POA: Diagnosis present

## 2019-04-01 DIAGNOSIS — I340 Nonrheumatic mitral (valve) insufficiency: Secondary | ICD-10-CM | POA: Diagnosis present

## 2019-04-01 DIAGNOSIS — Z20822 Contact with and (suspected) exposure to covid-19: Secondary | ICD-10-CM | POA: Diagnosis present

## 2019-04-01 DIAGNOSIS — E876 Hypokalemia: Secondary | ICD-10-CM | POA: Diagnosis present

## 2019-04-01 HISTORY — PX: LEFT HEART CATH AND CORONARY ANGIOGRAPHY: CATH118249

## 2019-04-01 LAB — RESPIRATORY PANEL BY RT PCR (FLU A&B, COVID)
Influenza A by PCR: NEGATIVE
Influenza B by PCR: NEGATIVE
SARS Coronavirus 2 by RT PCR: NEGATIVE

## 2019-04-01 LAB — CBC
HCT: 43.7 % (ref 36.0–46.0)
Hemoglobin: 15.1 g/dL — ABNORMAL HIGH (ref 12.0–15.0)
MCH: 32.3 pg (ref 26.0–34.0)
MCHC: 34.6 g/dL (ref 30.0–36.0)
MCV: 93.4 fL (ref 80.0–100.0)
Platelets: 150 10*3/uL (ref 150–400)
RBC: 4.68 MIL/uL (ref 3.87–5.11)
RDW: 11.9 % (ref 11.5–15.5)
WBC: 12.8 10*3/uL — ABNORMAL HIGH (ref 4.0–10.5)
nRBC: 0 % (ref 0.0–0.2)

## 2019-04-01 LAB — APTT: aPTT: 82 seconds — ABNORMAL HIGH (ref 24–36)

## 2019-04-01 LAB — LIPID PANEL
Cholesterol: 209 mg/dL — ABNORMAL HIGH (ref 0–200)
HDL: 57 mg/dL (ref >40)
LDL Cholesterol: 121 mg/dL — ABNORMAL HIGH (ref 0–99)
Total CHOL/HDL Ratio: 3.7 RATIO
Triglycerides: 156 mg/dL — ABNORMAL HIGH (ref <150)
VLDL: 31 mg/dL (ref 0–40)

## 2019-04-01 LAB — BASIC METABOLIC PANEL
Anion gap: 11 (ref 5–15)
BUN: 11 mg/dL (ref 6–20)
CO2: 25 mmol/L (ref 22–32)
Calcium: 9 mg/dL (ref 8.9–10.3)
Chloride: 100 mmol/L (ref 98–111)
Creatinine, Ser: 0.87 mg/dL (ref 0.44–1.00)
GFR calc Af Amer: >60 mL/min (ref >60)
GFR calc non Af Amer: >60 mL/min (ref >60)
Glucose, Bld: 96 mg/dL (ref 70–99)
Potassium: 3 mmol/L — ABNORMAL LOW (ref 3.5–5.1)
Sodium: 136 mmol/L (ref 135–145)

## 2019-04-01 LAB — PROTIME-INR
INR: 0.9 (ref 0.8–1.2)
Prothrombin Time: 11.6 seconds (ref 11.4–15.2)

## 2019-04-01 LAB — TROPONIN I (HIGH SENSITIVITY): Troponin I (High Sensitivity): 6484 ng/L (ref <18)

## 2019-04-01 SURGERY — LEFT HEART CATH AND CORONARY ANGIOGRAPHY
Anesthesia: Moderate Sedation

## 2019-04-01 MED ORDER — MIDAZOLAM HCL 2 MG/2ML IJ SOLN
INTRAMUSCULAR | Status: AC
  Filled 2019-04-01: qty 2

## 2019-04-01 MED ORDER — SODIUM CHLORIDE 0.9% FLUSH: 3.0000 mL | Freq: Once | INTRAVENOUS | Status: DC

## 2019-04-01 MED ORDER — NITROGLYCERIN IN D5W 200-5 MCG/ML-% IV SOLN
INTRAVENOUS | Status: AC | PRN
  Administered 2019-04-01: 10 ug/min via INTRAVENOUS

## 2019-04-01 MED ORDER — ASPIRIN 81 MG PO CHEW
324.0000 mg | CHEWABLE_TABLET | Freq: Once | ORAL | Status: AC
  Administered 2019-04-01: 324 mg via ORAL
  Filled 2019-04-01: qty 4

## 2019-04-01 MED ORDER — SODIUM CHLORIDE 0.9 % IV SOLN: INTRAVENOUS | Status: DC

## 2019-04-01 MED ORDER — HEPARIN (PORCINE) IN NACL 1000-0.9 UT/500ML-% IV SOLN
INTRAVENOUS | Status: DC | PRN
  Administered 2019-04-01: 500 mL

## 2019-04-01 MED ORDER — MORPHINE SULFATE (PF) 4 MG/ML IV SOLN
INTRAVENOUS | Status: AC
  Administered 2019-04-01: 23:00:00 4 mg
  Filled 2019-04-01: qty 1

## 2019-04-01 MED ORDER — HEPARIN SODIUM (PORCINE) 1000 UNIT/ML IJ SOLN
INTRAMUSCULAR | Status: AC
  Filled 2019-04-01: qty 1

## 2019-04-01 MED ORDER — HEPARIN SODIUM (PORCINE) 5000 UNIT/ML IJ SOLN
60.0000 [IU]/kg | Freq: Once | INTRAMUSCULAR | Status: AC
  Administered 2019-04-01: 23:00:00 3650 [IU] via INTRAVENOUS
  Filled 2019-04-01: qty 1

## 2019-04-01 MED ORDER — MIDAZOLAM HCL 2 MG/2ML IJ SOLN
INTRAMUSCULAR | Status: DC | PRN
  Administered 2019-04-01 (×2): 1 mg via INTRAVENOUS

## 2019-04-01 MED ORDER — FENTANYL CITRATE (PF) 100 MCG/2ML IJ SOLN
INTRAMUSCULAR | Status: AC
  Filled 2019-04-01: qty 2

## 2019-04-01 MED ORDER — NITROGLYCERIN 0.4 MG SL SUBL
SUBLINGUAL_TABLET | SUBLINGUAL | Status: AC
  Administered 2019-04-01: 23:00:00 0.4 mg
  Filled 2019-04-01: qty 1

## 2019-04-01 MED ORDER — FENTANYL CITRATE (PF) 100 MCG/2ML IJ SOLN
INTRAMUSCULAR | Status: DC | PRN
  Administered 2019-04-01 (×2): 25 ug via INTRAVENOUS

## 2019-04-01 MED ORDER — IOHEXOL 300 MG/ML  SOLN
INTRAMUSCULAR | Status: DC | PRN
  Administered 2019-04-01: 105 mL

## 2019-04-01 MED ORDER — NITROGLYCERIN IN D5W 200-5 MCG/ML-% IV SOLN
INTRAVENOUS | Status: AC
  Filled 2019-04-01: qty 250

## 2019-04-01 MED ORDER — VERAPAMIL HCL 2.5 MG/ML IV SOLN
INTRAVENOUS | Status: AC
  Filled 2019-04-01: qty 2

## 2019-04-01 SURGICAL SUPPLY — 8 items
CATH 5F 110X4 TIG (CATHETERS) ×3 IMPLANT
CATH INFINITI 5FR ANG PIGTAIL (CATHETERS) ×3 IMPLANT
DEVICE RAD TR BAND REGULAR (VASCULAR PRODUCTS) ×3 IMPLANT
GLIDESHEATH SLEND SS 6F .021 (SHEATH) ×3 IMPLANT
KIT MANI 3VAL PERCEP (MISCELLANEOUS) ×3 IMPLANT
PACK CARDIAC CATH (CUSTOM PROCEDURE TRAY) ×3 IMPLANT
WIRE HITORQ VERSACORE ST 145CM (WIRE) ×3 IMPLANT
WIRE ROSEN-J .035X260CM (WIRE) ×3 IMPLANT

## 2019-04-01 NOTE — ED Triage Notes (Signed)
Pt to ED from home c/o central and left chest pain that started after waking up today that is "sharp, stabbing, tight, pressure, heavy".  Pt states can't breathe.  Pt presents A&Ox4, skin WNL, chest rise even and unlabored.

## 2019-04-01 NOTE — ED Notes (Signed)
Code  stemi  called  to  [facility] 

## 2019-04-01 NOTE — ED Provider Notes (Signed)
[facility] Emergency Department Provider Note       Time seen: ----------------------------------------- 10:26 PM on 04/01/2019 -----------------------------------------   I have reviewed the triage vital signs and the nursing notes.  HISTORY   Chief Complaint Chest Pain and Shortness of Breath    HPI Molly Dickerson is a 39 y.o. female with a history of anxiety, chronic kidney disease, depression, drug addiction on Suboxone, DVT, kidney stones who presents to the ED for central lower chest pain that started after waking up this evening.  Patient states she has sharp, stabbing and tight pressure with heaviness.  She has never had this pain before.  It is 10 out of 10 on the left side.  Past Medical History:  Diagnosis Date  . Anxiety   . Chronic kidney disease   . Depression   . Drug addiction [facility])    h/o addiction to pain pills  . DVT (deep venous thrombosis) ([place]) 2010   post partum  . Kidney stones     There are no problems to display for this patient.   Past Surgical History:  Procedure Laterality Date  . KIDNEY STONE SURGERY    . LAPAROSCOPIC TUBAL LIGATION  09/26/2010   Procedure: LAPAROSCOPIC TUBAL LIGATION;  Surgeon: Osborne Oman, MD;  Location: [place] [facility];  Service: Gynecology;  Laterality: Bilateral;  . NASAL SINUS SURGERY      Allergies Patient has no known allergies.  Social History Social History   Tobacco Use  . Smoking status: Current Every Day Smoker    Packs/day: 0.50    Types: Cigarettes  . Smokeless tobacco: Never Used  Substance Use Topics  . Alcohol use: Yes  . Drug use: No    Review of Systems Constitutional: Negative for fever. Cardiovascular: Positive for chest pain Respiratory: Positive for shortness of breath Gastrointestinal: Negative for abdominal pain, vomiting and diarrhea. Musculoskeletal: Negative for back pain. Skin: Negative for rash. Neurological: Negative for headaches, focal weakness or  numbness.  All systems negative/normal/unremarkable except as stated in the HPI  ____________________________________________   PHYSICAL EXAM:  VITAL SIGNS: ED Triage Vitals  Enc Vitals Group     BP 04/01/19 2217 (!) 147/98     Pulse Rate 04/01/19 2217 99     Resp 04/01/19 2217 18     Temp 04/01/19 2217 98.5 F (36.9 C)     Temp Source 04/01/19 2217 Oral     SpO2 04/01/19 2217 99 %     Weight 04/01/19 2213 135 lb (61.2 kg)     Height 04/01/19 2213 5' (1.524 m)     Head Circumference --      Peak Flow --      Pain Score 04/01/19 2213 10     Pain Loc --      Pain Edu? --      Excl. in [place]? --     Constitutional: Alert and oriented.  Moderate distress Eyes: Conjunctivae are normal. Normal extraocular movements. ENT      Head: Normocephalic and atraumatic.      Nose: No congestion/rhinnorhea.      Mouth/Throat: Mucous membranes are moist.      Neck: No stridor. Cardiovascular: Rapid rate, regular rhythm. No murmurs, rubs, or gallops. Respiratory: Normal respiratory effort without tachypnea nor retractions. Breath sounds are clear and equal bilaterally. No wheezes/rales/rhonchi. Gastrointestinal: Soft and nontender. Normal bowel sounds Musculoskeletal: Nontender with normal range of motion in extremities. No lower extremity tenderness nor edema. Neurologic:  Normal speech and  language. No gross focal neurologic deficits are appreciated.  Skin:  Skin is warm, dry and intact. No rash noted. Psychiatric: Mood and affect are normal. Speech and behavior are normal.  ____________________________________________  EKG: Interpreted by me.  Sinus tachycardia with a rate of 107 bpm, left axis deviation, possibly acute inferior ST elevation as well as lateral ST elevation.  Normal QT  Repeat EKG interpreted by me with sinus rhythm with rate 72 bpm, acute inferior infarct, acute fart, normal QT  ____________________________________________  ED COURSE:  As part of my medical decision  making, I reviewed the following data within the electronic MEDICAL RECORD NUMBER History obtained from family if available, nursing notes, old chart and ekg, as well as notes from prior ED visits. Patient presented for sudden onset chest pain with concerns for STEMI, we will assess with labs and imaging as indicated at this time. Clinical Course as of Mar 31 2245  Tue Apr 01, 2019  2246 Patient was given some morphine and 1 sublingual nitroglycerin which dropped her pressures temporarily. She is receiving IV fluid bolus.   [JW]    Clinical Course User Index [JW] Emily Filbert, MD   Procedures  Molly Dickerson was evaluated in Emergency Department on 04/01/2019 for the symptoms described in the history of present illness. She was evaluated in the context of the global COVID-19 pandemic, which necessitated consideration that the patient might be at risk for infection with the SARS-CoV-2 virus that causes COVID-19. Institutional protocols and algorithms that pertain to the evaluation of patients at risk for COVID-19 are in a state of rapid change based on information released by regulatory bodies including the CDC and federal and state organizations. These policies and algorithms were followed during the patient's care in the ED.  ____________________________________________   LABS (pertinent positives/negatives)  Labs Reviewed  CBC - Abnormal; Notable for the following components:      Result Value   WBC 12.8 (*)    Hemoglobin 15.1 (*)    All other components within normal limits  APTT - Abnormal; Notable for the following components:   aPTT 82 (*)    All other components within normal limits  RESPIRATORY PANEL BY RT PCR (FLU A&B, COVID)  PROTIME-INR  BASIC METABOLIC PANEL  HEMOGLOBIN A1C  LIPID PANEL  POC URINE PREG, ED  POC URINE PREG, ED  TROPONIN I (HIGH SENSITIVITY)   CRITICAL CARE Performed by: Ulice Dash   Total critical care time: 30 minutes  Critical care  time was exclusive of separately billable procedures and treating other patients.  Critical care was necessary to treat or prevent imminent or life-threatening deterioration.  Critical care was time spent personally by me on the following activities: development of treatment plan with patient and/or surrogate as well as nursing, discussions with consultants, evaluation of patient's response to treatment, examination of patient, obtaining history from patient or surrogate, ordering and performing treatments and interventions, ordering and review of laboratory studies, ordering and review of radiographic studies, pulse oximetry and re-evaluation of patient's condition.  RADIOLOGY Images were viewed by me  Chest x-ray IMPRESSION:  No focal consolidation. Possible mild vascular congestion or viral  infection. Clinical correlation is recommended.  ____________________________________________   DIFFERENTIAL DIAGNOSIS   STEMI, NSTEMI, pericarditis, arrhythmia, pneumothorax, PE  FINAL ASSESSMENT AND PLAN  ST elevation MI   Plan: The patient had presented for sudden onset chest pain that started 3 to 4 hours ago.  STEMI protocol was initiated.  Patient's labs  are still pending at this time. Patient's imaging did not reveal any focal consolidation. Patient received standard STEMI protocol medications. I have contacted Dr. Okey Dupre from cardiology who was on STEMI call. Patient will be taken to the Cath Lab for further evaluation.   Ulice Dash, MD    Note: This note was generated in part or whole with voice recognition software. Voice recognition is usually quite accurate but there are transcription errors that can and very often do occur. I apologize for any typographical errors that were not detected and corrected.     Emily Filbert, MD 04/01/19 2248

## 2019-04-01 NOTE — ED Notes (Signed)
cardiologist at bedside

## 2019-04-01 NOTE — ED Notes (Signed)
Decision to go tot cath lab

## 2019-04-02 ENCOUNTER — Inpatient Hospital Stay (HOSPITAL_COMMUNITY)
Admit: 2019-04-02 | Discharge: 2019-04-02 | Disposition: A | Discharge status: Home / Self Care | Payer: Self-pay | Attending: Internal Medicine | Admitting: Internal Medicine

## 2019-04-02 ENCOUNTER — Other Ambulatory Visit: Payer: Self-pay | Admitting: Internal Medicine

## 2019-04-02 ENCOUNTER — Encounter: Payer: Self-pay | Admitting: Internal Medicine

## 2019-04-02 DIAGNOSIS — I255 Ischemic cardiomyopathy: Secondary | ICD-10-CM

## 2019-04-02 DIAGNOSIS — F1121 Opioid dependence, in remission: Secondary | ICD-10-CM

## 2019-04-02 DIAGNOSIS — E785 Hyperlipidemia, unspecified: Secondary | ICD-10-CM

## 2019-04-02 DIAGNOSIS — I2102 ST elevation (STEMI) myocardial infarction involving left anterior descending coronary artery: Secondary | ICD-10-CM

## 2019-04-02 DIAGNOSIS — I213 ST elevation (STEMI) myocardial infarction of unspecified site: Secondary | ICD-10-CM

## 2019-04-02 DIAGNOSIS — E876 Hypokalemia: Secondary | ICD-10-CM

## 2019-04-02 LAB — URINE DRUG SCREEN, QUALITATIVE (ARMC ONLY)
Amphetamines, Ur Screen: POSITIVE — AB
Barbiturates, Ur Screen: NOT DETECTED
Benzodiazepine, Ur Scrn: POSITIVE — AB
Cannabinoid 50 Ng, Ur ~~LOC~~: NOT DETECTED
Cocaine Metabolite,Ur ~~LOC~~: NOT DETECTED
MDMA (Ecstasy)Ur Screen: NOT DETECTED
Methadone Scn, Ur: NOT DETECTED
Opiate, Ur Screen: POSITIVE — AB
Phencyclidine (PCP) Ur S: NOT DETECTED
Tricyclic, Ur Screen: NOT DETECTED

## 2019-04-02 LAB — COMPREHENSIVE METABOLIC PANEL
ALT: 11 U/L (ref 0–44)
AST: 39 U/L (ref 15–41)
Albumin: 3.4 g/dL — ABNORMAL LOW (ref 3.5–5.0)
Alkaline Phosphatase: 64 U/L (ref 38–126)
Anion gap: 6 (ref 5–15)
BUN: 9 mg/dL (ref 6–20)
CO2: 23 mmol/L (ref 22–32)
Calcium: 8.1 mg/dL — ABNORMAL LOW (ref 8.9–10.3)
Chloride: 107 mmol/L (ref 98–111)
Creatinine, Ser: 0.69 mg/dL (ref 0.44–1.00)
GFR calc Af Amer: >60 mL/min (ref >60)
GFR calc non Af Amer: >60 mL/min (ref >60)
Glucose, Bld: 110 mg/dL — ABNORMAL HIGH (ref 70–99)
Potassium: 4.4 mmol/L (ref 3.5–5.1)
Sodium: 136 mmol/L (ref 135–145)
Total Bilirubin: 1.6 mg/dL — ABNORMAL HIGH (ref 0.3–1.2)
Total Protein: 6.9 g/dL (ref 6.5–8.1)

## 2019-04-02 LAB — TROPONIN I (HIGH SENSITIVITY)
Troponin I (High Sensitivity): 3600 ng/L (ref <18)
Troponin I (High Sensitivity): 3199 ng/L (ref <18)
Troponin I (High Sensitivity): 2165 ng/L (ref <18)

## 2019-04-02 LAB — CBC
HCT: 33.8 % — ABNORMAL LOW (ref 36.0–46.0)
Hemoglobin: 11.7 g/dL — ABNORMAL LOW (ref 12.0–15.0)
MCH: 32.9 pg (ref 26.0–34.0)
MCHC: 34.6 g/dL (ref 30.0–36.0)
MCV: 94.9 fL (ref 80.0–100.0)
Platelets: 132 10*3/uL — ABNORMAL LOW (ref 150–400)
RBC: 3.56 MIL/uL — ABNORMAL LOW (ref 3.87–5.11)
RDW: 11.9 % (ref 11.5–15.5)
WBC: 9.2 10*3/uL (ref 4.0–10.5)
nRBC: 0 % (ref 0.0–0.2)

## 2019-04-02 LAB — HEPARIN LEVEL (UNFRACTIONATED)
Heparin Unfractionated: 0.34 IU/mL (ref 0.30–0.70)
Heparin Unfractionated: 0.21 IU/mL — ABNORMAL LOW (ref 0.30–0.70)

## 2019-04-02 LAB — APTT
aPTT: >160 seconds (ref 24–36)
aPTT: >160 seconds (ref 24–36)

## 2019-04-02 LAB — ECHOCARDIOGRAM COMPLETE
Height: 60 in
Weight: 2160 oz

## 2019-04-02 LAB — GLUCOSE, CAPILLARY: Glucose-Capillary: 101 mg/dL — ABNORMAL HIGH (ref 70–99)

## 2019-04-02 LAB — MRSA PCR SCREENING: MRSA by PCR: NEGATIVE

## 2019-04-02 LAB — PREGNANCY, URINE: Preg Test, Ur: NEGATIVE

## 2019-04-02 MED ORDER — BUPRENORPHINE HCL-NALOXONE HCL 8-2 MG SL SUBL: 1.0000 | SUBLINGUAL_TABLET | Freq: Three times a day (TID) | SUBLINGUAL | Status: DC

## 2019-04-02 MED ORDER — HEPARIN (PORCINE) 25000 UT/250ML-% IV SOLN
450.0000 [IU]/h | INTRAVENOUS | Status: DC
  Administered 2019-04-02: 01:00:00 750 [IU]/h via INTRAVENOUS
  Filled 2019-04-02 (×2): qty 250

## 2019-04-02 MED ORDER — HYDRALAZINE HCL 20 MG/ML IJ SOLN: 10.0000 mg | INTRAMUSCULAR | Status: AC | PRN

## 2019-04-02 MED ORDER — LABETALOL HCL 5 MG/ML IV SOLN: 10.0000 mg | INTRAVENOUS | Status: AC | PRN

## 2019-04-02 MED ORDER — VERAPAMIL HCL 2.5 MG/ML IV SOLN: INTRAVENOUS | Status: DC | PRN

## 2019-04-02 MED ORDER — COLCHICINE 0.6 MG PO TABS
0.6000 mg | ORAL_TABLET | Freq: Two times a day (BID) | ORAL | Status: DC
  Administered 2019-04-02 – 2019-04-04 (×6): 0.6 mg via ORAL
  Filled 2019-04-02 (×7): qty 1

## 2019-04-02 MED ORDER — MORPHINE SULFATE (PF) 2 MG/ML IV SOLN
2.0000 mg | INTRAVENOUS | Status: DC | PRN
  Administered 2019-04-02: 2 mg via INTRAVENOUS
  Filled 2019-04-02: qty 1

## 2019-04-02 MED ORDER — MORPHINE SULFATE (PF) 2 MG/ML IV SOLN
2.0000 mg | INTRAVENOUS | Status: DC | PRN
  Administered 2019-04-02: 10:00:00 2 mg via INTRAVENOUS
  Filled 2019-04-02: qty 1

## 2019-04-02 MED ORDER — CLOPIDOGREL BISULFATE 75 MG PO TABS
75.0000 mg | ORAL_TABLET | Freq: Every day | ORAL | Status: DC
  Administered 2019-04-02 – 2019-04-04 (×3): 75 mg via ORAL
  Filled 2019-04-02 (×3): qty 1

## 2019-04-02 MED ORDER — ATORVASTATIN CALCIUM 20 MG PO TABS
40.0000 mg | ORAL_TABLET | Freq: Every day | ORAL | Status: DC
  Administered 2019-04-02 – 2019-04-03 (×2): 40 mg via ORAL
  Filled 2019-04-02 (×2): qty 2

## 2019-04-02 MED ORDER — BUPRENORPHINE HCL-NALOXONE HCL 8-2 MG SL SUBL
1.0000 | SUBLINGUAL_TABLET | Freq: Three times a day (TID) | SUBLINGUAL | Status: DC
  Administered 2019-04-02 – 2019-04-04 (×6): 1 via SUBLINGUAL
  Filled 2019-04-02 (×6): qty 1

## 2019-04-02 MED ORDER — CLOPIDOGREL BISULFATE 75 MG PO TABS
300.0000 mg | ORAL_TABLET | Freq: Once | ORAL | Status: AC
  Administered 2019-04-02: 01:00:00 300 mg via ORAL
  Filled 2019-04-02: qty 4

## 2019-04-02 MED ORDER — NICOTINE 14 MG/24HR TD PT24
14.0000 mg | MEDICATED_PATCH | Freq: Every day | TRANSDERMAL | Status: DC
  Administered 2019-04-02 – 2019-04-04 (×3): 14 mg via TRANSDERMAL
  Filled 2019-04-02 (×3): qty 1

## 2019-04-02 MED ORDER — METOPROLOL TARTRATE 25 MG PO TABS
12.5000 mg | ORAL_TABLET | Freq: Two times a day (BID) | ORAL | Status: DC
  Administered 2019-04-02 – 2019-04-04 (×5): 12.5 mg via ORAL
  Filled 2019-04-02 (×6): qty 1

## 2019-04-02 MED ORDER — SODIUM CHLORIDE 0.9 % IV SOLN: 250.0000 mL | INTRAVENOUS | Status: DC | PRN

## 2019-04-02 MED ORDER — POTASSIUM CHLORIDE CRYS ER 20 MEQ PO TBCR
40.0000 meq | EXTENDED_RELEASE_TABLET | ORAL | Status: AC
  Administered 2019-04-02 (×2): 40 meq via ORAL
  Filled 2019-04-02 (×2): qty 2

## 2019-04-02 MED ORDER — ACETAMINOPHEN 325 MG PO TABS
650.0000 mg | ORAL_TABLET | ORAL | Status: DC | PRN
  Administered 2019-04-02 – 2019-04-04 (×2): 650 mg via ORAL
  Filled 2019-04-02 (×2): qty 2

## 2019-04-02 MED ORDER — SODIUM CHLORIDE 0.9% FLUSH
3.0000 mL | Freq: Two times a day (BID) | INTRAVENOUS | Status: DC
  Administered 2019-04-02: 10:00:00 3 mL via INTRAVENOUS
  Administered 2019-04-02: 01:00:00 10 mL via INTRAVENOUS
  Administered 2019-04-02 – 2019-04-04 (×4): 3 mL via INTRAVENOUS

## 2019-04-02 MED ORDER — CHLORHEXIDINE GLUCONATE CLOTH 2 % EX PADS
6.0000 | MEDICATED_PAD | Freq: Every day | CUTANEOUS | Status: DC
  Administered 2019-04-03: 10:00:00 6 via TOPICAL

## 2019-04-02 MED ORDER — ONDANSETRON HCL 4 MG/2ML IJ SOLN: 4.0000 mg | Freq: Four times a day (QID) | INTRAMUSCULAR | Status: DC | PRN

## 2019-04-02 MED ORDER — NITROGLYCERIN IN D5W 200-5 MCG/ML-% IV SOLN: 0.0000 ug/min | INTRAVENOUS | Status: DC

## 2019-04-02 MED ORDER — ASPIRIN 81 MG PO CHEW
81.0000 mg | CHEWABLE_TABLET | Freq: Every day | ORAL | Status: DC
  Administered 2019-04-02 – 2019-04-04 (×3): 81 mg via ORAL
  Filled 2019-04-02 (×3): qty 1

## 2019-04-02 MED ORDER — PNEUMOCOCCAL VAC POLYVALENT 25 MCG/0.5ML IJ INJ: 0.5000 mL | INJECTION | INTRAMUSCULAR | Status: DC

## 2019-04-02 MED ORDER — SODIUM CHLORIDE 0.9% FLUSH: 3.0000 mL | INTRAVENOUS | Status: DC | PRN

## 2019-04-02 NOTE — Progress Notes (Signed)
CH attempted visit in ICU as follow-up from other chaplains' referrals.  Pt. asleep; will attempt follow-up at later time if possible.       04/02/19 1430  Clinical Encounter Type  Visited With Patient not available  Visit Type Follow-up  Referral From Chaplain  Consult/Referral To Chaplain

## 2019-04-02 NOTE — Progress Notes (Signed)
ANTICOAGULATION CONSULT NOTE  Pharmacy Consult for heparin Indication: chest pain/ACS  No Known Allergies  Patient Measurements: Height: 5' (152.4 cm) Weight: 135 lb (61.2 kg) IBW/kg (Calculated) : 45.5 Heparin Dosing Weight: 69.4 kg  Vital Signs: Temp: 98.7 F (37.1 C) (02/10 1200) Temp Source: Oral (02/10 1200) BP: 110/79 (02/10 1100) Pulse Rate: 79 (02/10 1200)  Labs: Recent Labs    04/01/19 2218 04/01/19 2227 04/02/19 0454 04/02/19 0721 04/02/19 1006  HGB 15.1*  --   --  11.7*  --   HCT 43.7  --   --  33.8*  --   PLT 150  --   --  132*  --   APTT  --  82*  --  >160*  --   LABPROT  --  11.6  --   --   --   INR  --  0.9  --   --   --   HEPARINUNFRC  --   --   --  0.34  --   CREATININE 0.87  --   --  0.69  --   TROPONINIHS 6,812*  --  3,600*  --  3,199*    Estimated Creatinine Clearance: 78 mL/min (by C-G formula based on SCr of 0.69 mg/dL).   Medical History: Past Medical History:  Diagnosis Date  . Anxiety   . Chronic kidney disease   . Depression   . Drug addiction [facility])    h/o addiction to pain pills  . DVT (deep venous thrombosis) ([facility]) 2010   post partum  . Kidney stones     Medications:  Scheduled:  . aspirin  81 mg Oral Daily  . atorvastatin  40 mg Oral q1800  . Chlorhexidine Gluconate Cloth  6 each Topical Daily  . clopidogrel  75 mg Oral Daily  . colchicine  0.6 mg Oral BID  . metoprolol tartrate  12.5 mg Oral BID  . sodium chloride flush  3 mL Intravenous Once  . sodium chloride flush  3 mL Intravenous Q12H    Assessment: Patient is a CODE STEMI was taken to cath for PCI, pt has h/o DVT w/ a h/o being on warfarin (patient's med rec has not been updated since 2012). Baseline aPTT was 82 seconds (prior to receiving any heparin). CBC/INR WNL. Patient is being started on heparin drip for post-PCI management of STEMI.  Goal of Therapy:  Heparin level 0.3-0.7 units/ml Monitor platelets by anticoagulation protocol: Yes   Plan:  Heparin  level in range at 0.34, APTT is > 160. Decreased rate to 650 units/hr. APTT elevated at baseline. Will recheck both at 1600.   Plan is to continue heparin drip for 48 hours post band removal.   Pharmacy will continue to monitor and adjust per consult.   [name]  04/02/2019,4:13 PM

## 2019-04-02 NOTE — Progress Notes (Signed)
ANTICOAGULATION CONSULT NOTE  Pharmacy Consult for heparin Indication: chest pain/ACS  No Known Allergies  Patient Measurements: Height: 5' (152.4 cm) Weight: 135 lb (61.2 kg) IBW/kg (Calculated) : 45.5 Heparin Dosing Weight: 69.4 kg  Vital Signs: Temp: 98.7 F (37.1 C) (02/10 1200) Temp Source: Oral (02/10 1200) BP: 110/79 (02/10 1100) Pulse Rate: 79 (02/10 1200)  Labs: Recent Labs    04/01/19 2218 04/01/19 2227 04/02/19 0454 04/02/19 0721 04/02/19 1006  HGB 15.1*  --   --  11.7*  --   HCT 43.7  --   --  33.8*  --   PLT 150  --   --  132*  --   APTT  --  82*  --  >160*  --   LABPROT  --  11.6  --   --   --   INR  --  0.9  --   --   --   HEPARINUNFRC  --   --   --  0.34  --   CREATININE 0.87  --   --  0.69  --   TROPONINIHS 6,010*  --  3,600*  --  3,199*    Estimated Creatinine Clearance: 78 mL/min (by C-G formula based on SCr of 0.69 mg/dL).   Medical History: Past Medical History:  Diagnosis Date  . Anxiety   . Chronic kidney disease   . Depression   . Drug addiction [facility])    h/o addiction to pain pills  . DVT (deep venous thrombosis) ([facility]) 2010   post partum  . Kidney stones     Medications:  Scheduled:  . aspirin  81 mg Oral Daily  . atorvastatin  40 mg Oral q1800  . Chlorhexidine Gluconate Cloth  6 each Topical Daily  . clopidogrel  75 mg Oral Daily  . colchicine  0.6 mg Oral BID  . metoprolol tartrate  12.5 mg Oral BID  . sodium chloride flush  3 mL Intravenous Once  . sodium chloride flush  3 mL Intravenous Q12H    Assessment: Patient is a CODE STEMI was taken to cath for PCI, pt has h/o DVT w/ a h/o being on warfarin (patient's med rec has not been updated since 2012). Baseline aPTT was 82 seconds (prior to receiving any heparin). CBC/INR WNL. Patient was started on heparin drip for post-PCI management of STEMI.  Heparin Course: 2/09 initiation: 3650 unit bolus, then 750 units/hr 2/10 0721 aPTT>160s, HL 0.34: dec rate to 650 units/hr   2/10 1630 aPTT>160s, HL 0.21: dec rate to 550 units/hr  Goal of Therapy:  Heparin level 0.3-0.7 units/ml Monitor platelets by anticoagulation protocol: Yes   Plan:   Heparin level lower than therapeutic range at 0.34, APTT is > 160  Given that baseline aPTT is severely elevated I will base the adjustment off of the supratherapeutic aPTT: decrease rate to 550 units/hr  recheck both HL and aPTT in 6 hours after rate adjustment   Plan is to continue heparin drip for 48 hours post band removal.   Pharmacy will continue to monitor and adjust per consult.   Molly Dickerson  04/02/2019,4:39 PM

## 2019-04-02 NOTE — Progress Notes (Signed)
Code Stemi. Chaplain provided supportive presence to Leiya and boyfriend upon arrival. Both were anxious and tearful upon arrival. Boyfriend reports they are homeless, his younger brother died on 06-04-22, Edie's mother died within past two years. He shared Kenniyah has anxiety medication however they were not able to afford the recent prescription.  Boyfriend was thankful for opportunity to be with Alleta prior to procedure and returned to parking lot to await call following procedure. Chaplain spoke to ICU nurse to ask that boyfriend be called once Tiarna was out of procedure.    04/02/19 0000  Clinical Encounter Type  Visited With Patient and family together  Visit Type Initial;ED  Referral From Nurse  Spiritual Encounters  Spiritual Needs Emotional;Grief support

## 2019-04-02 NOTE — Progress Notes (Signed)
ANTICOAGULATION CONSULT NOTE - Initial Consult  Pharmacy Consult for heparin Indication: chest pain/ACS  No Known Allergies  Patient Measurements: Height: 5' (152.4 cm) Weight: 135 lb (61.2 kg) IBW/kg (Calculated) : 45.5 Heparin Dosing Weight: 69.4 kg  Vital Signs: Temp: 98.5 F (36.9 C) (02/09 2217) Temp Source: Oral (02/09 2217) BP: 147/98 (02/09 2217) Pulse Rate: 99 (02/09 2217)  Labs: Recent Labs    04/01/19 2218 04/01/19 2227  HGB 15.1*  --   HCT 43.7  --   PLT 150  --   APTT  --  82*  LABPROT  --  11.6  INR  --  0.9  CREATININE 0.87  --   TROPONINIHS 3,419*  --     Estimated Creatinine Clearance: 71.7 mL/min (by C-G formula based on SCr of 0.87 mg/dL).   Medical History: Past Medical History:  Diagnosis Date  . Anxiety   . Chronic kidney disease   . Depression   . Drug addiction [facility])    h/o addiction to pain pills  . DVT (deep venous thrombosis) ([facility]) 2010   post partum  . Kidney stones     Medications:  Scheduled:  . aspirin  81 mg Oral Daily  . atorvastatin  40 mg Oral q1800  . Chlorhexidine Gluconate Cloth  6 each Topical Daily  . clopidogrel  300 mg Oral Once  . clopidogrel  75 mg Oral Daily  . colchicine  0.6 mg Oral BID  . metoprolol tartrate  12.5 mg Oral BID  . potassium chloride  40 mEq Oral Q4H  . sodium chloride flush  3 mL Intravenous Once  . sodium chloride flush  3 mL Intravenous Q12H    Assessment: Patient is a CODE STEMI was taken to cath for PCI, pt has h/o DVT w/ a h/o being on warfarin (patient's med rec has not been updated since 2012). Baseline aPTT was 82 seconds (prior to receiving any heparin). CBC/INR WNL. Patient is being started on heparin drip for post-PCI management of STEMI.  Goal of Therapy:  Heparin level 0.3-0.7 units/ml Monitor platelets by anticoagulation protocol: Yes   Plan:  Patient was bolused w/ heparin 3650 units IV x 1 pre-cath. Will start rate at 750 units/hr and will recheck aPTT (since  baseline was elevated) and anti-Xa level at 0700. Will monitor daily CBC's and adjust per aPTT if anti-Xa level is also elevated; until both correlate then will dose off anti-Xa level.  Thomasene Ripple, PharmD, BCPS Clinical Pharmacist 04/02/2019,12:31 AM

## 2019-04-02 NOTE — Progress Notes (Signed)
*  PRELIMINARY RESULTS* Echocardiogram 2D Echocardiogram has been performed.  Cristela Blue 04/02/2019, 10:27 AM

## 2019-04-02 NOTE — Progress Notes (Signed)
eLink Physician-Brief Progress Note Patient Name: Molly Dickerson DOB: Dec 06, 1980 MRN: [id number]   Date of Service  04/02/2019  HPI/Events of Note  Pt admitted by cardiology for a STEMI, she is s/p PCI, she is on a Nitroglycerine infusion for post-procedure chest pain.  eICU Interventions  New Patient Evaluation completed.        Danisha Brassfield U Woodrow Drab 04/02/2019, 1:02 AM

## 2019-04-02 NOTE — H&P (Addendum)
Cardiology Admission History and Physical:   Patient ID: Molly Dickerson MRN: [id number]; DOB: December 16, 1980   Admission date: 04/01/2019  Primary Care Provider: Patient, No Pcp Per Primary Cardiologist: New - Nalla Purdy Primary Electrophysiologist:  None   Chief Complaint:  Chest pain  Patient Profile:   Molly Dickerson is a 39 y.o. female with history of postpartum DVT, kidney stones, and opioid dependence on Suboxone, presenting with acute onset of chest pain and anterolateral/inferior ST elevation concerning for STEMI.  History of Present Illness:   Molly Dickerson was in her usual state of health until she awoke from a nap around 7 PM.  She had severe central chest pain rating to her neck, jaw, and back.  It was worsened with deep inspiration and seemed to improve somewhat with sitting up.  She reports associated dyspnea and nausea.  She had felt well earlier in the day.  She has never had pain like this before, rating it up to 10/10.  Her significant other brought her to the emergency department, where she was noted to have predominantly inferior as well as subtle anterolateral ST segment elevation.  Limited bedside echocardiogram performed by myself was notable for apical hypokinesis with otherwise vigorous left ventricular contraction.  No pericardial effusion was seen.  RV size and function were grossly normal.  Based on these findings, decision was made to proceed with emergent cardiac catheterization.  The patient had been given aspirin and nitroglycerin with some improvement in pain (transiently resolved but then back to 5/10 in the emergency department).  She also received 4000 units of heparin prior to transport for emergent cardiac catheterization.  Catheterization revealed occlusion of the apical LAD with otherwise no significant CAD.  Left ventriculogram showed focal apical akinesis consistent with occlusion of the apical LAD.  Patient reports 7/10 chest pain at the Molly Dickerson of the procedure,  prompting initiation of nitroglycerin infusion for pain control.  Heart Pathway Score:     Past Medical History:  Diagnosis Date  . Anxiety   . Chronic kidney disease   . Depression   . Drug addiction [facility])    h/o addiction to pain pills  . DVT (deep venous thrombosis) ([place]) 2010   post partum  . Kidney stones     Past Surgical History:  Procedure Laterality Date  . KIDNEY STONE SURGERY    . LAPAROSCOPIC TUBAL LIGATION  09/26/2010   Procedure: LAPAROSCOPIC TUBAL LIGATION;  Surgeon: Osborne Oman, MD;  Location: [facility];  Service: Gynecology;  Laterality: Bilateral;  . NASAL SINUS SURGERY      Medications Prior to Admission: Prior to Admission medications   Medication Sig Start Date Emer Onnen Date Taking? Authorizing Provider  buprenorphine-naloxone (SUBOXONE) 8-2 mg SUBL SL tablet Place 1 tablet under the tongue 3 (three) times daily. 03/28/19   [provider]     Allergies:   No Known Allergies  Social History:     Social History   Tobacco Use  . Smoking status: Current Every Day Smoker    Packs/day: 0.50    Types: Cigarettes  . Smokeless tobacco: Never Used  Substance Use Topics  . Alcohol use: Yes    Comment: few drinks a week.  . Drug use: Not Currently    Comment: history of opioid abuse; on suboxone x 8 hears    Family History:   The patient's family history is negative for Heart disease.    ROS:  Review of Systems  Unable to perform ROS: Acuity of condition  Physical Exam/Data:   Vitals:   04/01/19 2213 04/01/19 2217  BP:  (!) 147/98  Pulse:  99  Resp:  18  Temp:  98.5 F (36.9 C)  TempSrc:  Oral  SpO2:  99%  Weight: 61.2 kg   Height: 5' (1.524 m)    No intake or output data in the 24 hours ending 04/02/19 0010 Last 3 Weights 04/01/2019 09/23/2010  Weight (lbs) 135 lb 118 lb  Weight (kg) 61.236 kg 53.524 kg     Body mass index is 26.37 kg/m.  General: Young woman, lying on stretcher, writhing in pain. HEENT: Poor dentition.  Clear  oropharynx.  Anicteric sclera.  No conjunctival pallor. Lymph: no adenopathy Neck: no JVD Endocrine:  No thryomegaly Vascular: 2+ radial and pedal pulses bilaterally. Cardiac: Regular rate and rhythm without murmurs, rubs, or gallops. Lungs:  clear to auscultation bilaterally, no wheezing, rhonchi or rales  Abd: soft, nontender, no hepatomegaly  Ext: no lower extremity edema Musculoskeletal:  No deformities, BUE and BLE strength normal and equal Skin: warm and dry  Neuro:  CNs 2-12 intact, no focal abnormalities noted Psych:  Normal affect    EKG:  The ECG that was done 04/01/2019 at 10:12 PM was personally reviewed and demonstrates sinus tachycardia with inferior Q waves, poor R wave progression, and ST elevation in V4 through V6 as well as leads III and aVF.  Relevant CV Studies: LHC (04/01/19): Diagnostic Dominance: Right    Laboratory Data:  High Sensitivity Troponin:   Recent Labs  Lab 04/01/19 2218  TROPONINIHS 6,484*      Chemistry Recent Labs  Lab 04/01/19 2218  NA 136  K 3.0*  CL 100  CO2 25  GLUCOSE 96  BUN 11  CREATININE 0.87  CALCIUM 9.0  GFRNONAA >60  GFRAA >60  ANIONGAP 11    No results for input(s): PROT, ALBUMIN, AST, ALT, ALKPHOS, BILITOT in the last 168 hours. Hematology Recent Labs  Lab 04/01/19 2218  WBC 12.8*  RBC 4.68  HGB 15.1*  HCT 43.7  MCV 93.4  MCH 32.3  MCHC 34.6  RDW 11.9  PLT 150   BNPNo results for input(s): BNP, PROBNP in the last 168 hours.  DDimer No results for input(s): DDIMER in the last 168 hours.   Radiology/Studies:  CARDIAC CATHETERIZATION  Result Date: 04/01/2019 Conclusions: 1. Single vessel coronary artery disease with occlusion of the apical LAD.  Otherwise, there is no angiographically significant coronary artery disease. 2. Apical akinesis with otherwise hyperdynamic left ventricular systolic function. 3. Mildly elevated left ventricular filling pressure. Recommendations: 1. Medical therapy, as apical LAD  occlusion is too small/distal for percutaneous intervention (vessel diameter less than 2 mm).  We will titrate nitroglycerin infusion for relief of chest pain and start heparin infusion 2 hours after TR band has been removed (to be continued for 48 hours). 2. Dual antiplatelet therapy with aspirin and clopidogrel. 3. High intensity statin therapy. Molly Kendall, MD [facility]   DG Chest Port 1 View  Result Date: 04/01/2019 CLINICAL DATA:  39 year old female with chest pain. EXAM: PORTABLE CHEST 1 VIEW COMPARISON:  Chest radiograph dated 03/26/2006. FINDINGS: Mild diffuse vascular prominence may represent mild congestion. Atypical infection is less likely but not excluded. Clinical correlation is recommended. Bibasilar linear and platelike atelectasis/scarring. No pleural effusion or pneumothorax. The cardiac silhouette is within normal limits. No acute osseous pathology. IMPRESSION: No focal consolidation. Possible mild vascular congestion or viral infection. Clinical correlation is recommended. Electronically Signed   By:  Elgie Collard M.D.   On: 04/01/2019 22:39   TIMI Risk Score for ST  Elevation MI:   The patient's TIMI risk score is 3, which indicates a 4.4% risk of all cause mortality at 30 days.    Assessment and Plan:   STEMI: Chest pain with associated EKG changes described above are consistent with finding of occlusion of apical LAD that wraps around the apex and also supplies a portion of the inferior wall.  Mechanism is somewhat unclear, though I suspect it was likely an acute thrombotic occlusion.  Spontaneous coronary artery dissection is also a consideration, given the patient's young age and otherwise lack of significant CAD.  Given her history of remote DVT, underlying hypercoagulable state is certainly a concern.  Admit to ICU.  Initiate heparin infusion 2 hours after TR band removal, to be continued for 48 hours.  Dual antiplatelet therapy with aspirin and clopidogrel for  up to 12 months.  I will load with clopidogrel 300 mg x 1 and continue 75 mg daily thereafter.  We will need to consider a hypercoagulable workup.  Titrate nitroglycerin infusion for relief of chest pain.  Start metoprolol tartrate 12.5 mg twice daily.  Morphine available for severe breakthrough pain, though I would like to minimize this given the patient's history of substance abuse.  Trend troponin until it has peaked, then stop.  High intensity statin therapy; we will add atorvastatin 40 mg daily.  Obtain transthoracic echocardiogram.  Given pleuritic component to chest pain, I wonder if Molly Dickerson may already have developed a degree of pericarditis.  We will try colchicine 0.6 mg twice daily for pain control.  Ischemic cardiomyopathy: Focal wall motion abnormality noted at the apex, consistent with territory supplied by apical LAD.  LVEDP mildly elevated with chest radiograph also showing mild vascular congestion.  Obtain transthoracic echocardiogram.  Defer post catheterization IV fluids.  If patient develops dyspnea, consider gentle diuresis.  Defer adding ACEI/ARB at this time, as EF is preserved and to preclude hypotension as NTG and metoprolol are titrated.  Hyperlipidemia: LDL at the time presentation was elevated at 121 (goal less than 70).  Patient started on atorvastatin 40 mg daily.  Polysubstance abuse: Patient with history of opioid dependence, chronically on Suboxone.  In the setting of ongoing chest pain, we will hold Suboxone in order to allow for judicious morphine use for relief of chest pain if it is refractory to nitroglycerin.  Restart home dose of Suboxone once morphine no longer needed.  Obtain urine drug screen  Hypokalemia: Potassium of 3.0 noted.  Replete potassium for target potassium greater than 4.0.  Leukocytosis: Likely stress reaction in the setting of acute MI.  Chest radiograph notable for questionable mild vascular congestion versus  viral infection.  Symptoms most consistent with acute MI rather than infectious etiology.  Continue to monitor.  Severity of Illness: The appropriate patient status for this patient is INPATIENT. Inpatient status is judged to be reasonable and necessary in order to provide the required intensity of service to ensure the patient's safety. The patient's presenting symptoms, physical exam findings, and initial radiographic and laboratory data in the context of their chronic comorbidities is felt to place them at high risk for further clinical deterioration. Furthermore, it is not anticipated that the patient will be medically stable for discharge from the hospital within 2 midnights of admission. The following factors support the patient status of inpatient.   " The patient's presenting symptoms include severe chest pain. " The  initial radiographic and laboratory data are worrisome because of anterolateral and inferior ST elevation on presenting EKG. " The chronic co-morbidities include polysubstance abuse.   * I certify that at the point of admission it is my clinical judgment that the patient will require inpatient hospital care spanning beyond 2 midnights from the point of admission due to high intensity of service, high risk for further deterioration and high frequency of surveillance required.*   For questions or updates, please contact [facility] Please consult [email or web address] for contact info under [facility] Card[facility].   Signed, Molly Kendall, MD  04/02/2019 12:10 AM

## 2019-04-03 ENCOUNTER — Telehealth: Payer: Self-pay | Admitting: Internal Medicine

## 2019-04-03 DIAGNOSIS — F1910 Other psychoactive substance abuse, uncomplicated: Secondary | ICD-10-CM

## 2019-04-03 DIAGNOSIS — E782 Mixed hyperlipidemia: Secondary | ICD-10-CM

## 2019-04-03 LAB — BASIC METABOLIC PANEL
Anion gap: 9 (ref 5–15)
BUN: 7 mg/dL (ref 6–20)
CO2: 22 mmol/L (ref 22–32)
Calcium: 8.4 mg/dL — ABNORMAL LOW (ref 8.9–10.3)
Chloride: 107 mmol/L (ref 98–111)
Creatinine, Ser: 0.84 mg/dL (ref 0.44–1.00)
GFR calc Af Amer: >60 mL/min (ref >60)
GFR calc non Af Amer: >60 mL/min (ref >60)
Glucose, Bld: 119 mg/dL — ABNORMAL HIGH (ref 70–99)
Potassium: 4 mmol/L (ref 3.5–5.1)
Sodium: 138 mmol/L (ref 135–145)

## 2019-04-03 LAB — CBC
HCT: 32.4 % — ABNORMAL LOW (ref 36.0–46.0)
Hemoglobin: 11 g/dL — ABNORMAL LOW (ref 12.0–15.0)
MCH: 32.7 pg (ref 26.0–34.0)
MCHC: 34 g/dL (ref 30.0–36.0)
MCV: 96.4 fL (ref 80.0–100.0)
Platelets: 153 10*3/uL (ref 150–400)
RBC: 3.36 MIL/uL — ABNORMAL LOW (ref 3.87–5.11)
RDW: 12 % (ref 11.5–15.5)
WBC: 6.2 10*3/uL (ref 4.0–10.5)
nRBC: 0 % (ref 0.0–0.2)

## 2019-04-03 LAB — TROPONIN I (HIGH SENSITIVITY): Troponin I (High Sensitivity): 1834 ng/L (ref <18)

## 2019-04-03 LAB — APTT
aPTT: 115 seconds — ABNORMAL HIGH (ref 24–36)
aPTT: 94 seconds — ABNORMAL HIGH (ref 24–36)

## 2019-04-03 LAB — HEMOGLOBIN A1C
Hgb A1c MFr Bld: 4.6 % — ABNORMAL LOW (ref 4.8–5.6)
Mean Plasma Glucose: 85 mg/dL

## 2019-04-03 LAB — HEPARIN LEVEL (UNFRACTIONATED): Heparin Unfractionated: <0.1 IU/mL — ABNORMAL LOW (ref 0.30–0.70)

## 2019-04-03 MED ORDER — KETOROLAC TROMETHAMINE 10 MG PO TABS
10.0000 mg | ORAL_TABLET | Freq: Three times a day (TID) | ORAL | Status: DC | PRN
  Administered 2019-04-03 – 2019-04-04 (×3): 10 mg via ORAL
  Filled 2019-04-03 (×4): qty 1

## 2019-04-03 MED ORDER — ENOXAPARIN SODIUM 60 MG/0.6ML ~~LOC~~ SOLN
1.0000 mg/kg | Freq: Two times a day (BID) | SUBCUTANEOUS | Status: AC
  Administered 2019-04-03 (×2): 55 mg via SUBCUTANEOUS
  Filled 2019-04-03 (×2): qty 0.6

## 2019-04-03 NOTE — Progress Notes (Signed)
ANTICOAGULATION CONSULT NOTE  Pharmacy Consult for heparin Indication: chest pain/ACS  No Known Allergies  Patient Measurements: Height: 5' (152.4 cm) Weight: 122 lb 4.8 oz (55.5 kg) IBW/kg (Calculated) : 45.5 Heparin Dosing Weight: 69.4 kg  Vital Signs: Temp: 98.2 F (36.8 C) (02/10 2007) Temp Source: Oral (02/10 2007) BP: 100/65 (02/10 2007) Pulse Rate: 84 (02/10 2007)  Labs: Recent Labs    04/01/19 2218 04/01/19 2218 04/01/19 2227 04/02/19 0454 04/02/19 0721 04/02/19 1006 04/02/19 1630 04/03/19 0201  HGB 15.1*   < >  --   --  11.7*  --   --  11.0*  HCT 43.7  --   --   --  33.8*  --   --  32.4*  PLT 150  --   --   --  132*  --   --  153  APTT  --    < > 82*  --  >160*  --  >160* 115*  LABPROT  --   --  11.6  --   --   --   --   --   INR  --   --  0.9  --   --   --   --   --   HEPARINUNFRC  --   --   --   --  0.34  --  0.21*  --   CREATININE 0.87  --   --   --  0.69  --   --   --   TROPONINIHS 6,484*   < >  --  3,600*  --  3,199* 2,165*  --    < > = values in this interval not displayed.    Estimated Creatinine Clearance: 74.5 mL/min (by C-G formula based on SCr of 0.69 mg/dL).   Medical History: Past Medical History:  Diagnosis Date  . Anxiety   . Chronic kidney disease   . Depression   . Drug addiction [facility])    h/o addiction to pain pills  . DVT (deep venous thrombosis) ([facility]) 2010   post partum  . Kidney stones     Medications:  Scheduled:  . aspirin  81 mg Oral Daily  . atorvastatin  40 mg Oral q1800  . buprenorphine-naloxone  1 tablet Sublingual Q8H  . Chlorhexidine Gluconate Cloth  6 each Topical Daily  . clopidogrel  75 mg Oral Daily  . colchicine  0.6 mg Oral BID  . metoprolol tartrate  12.5 mg Oral BID  . nicotine  14 mg Transdermal Daily  . pneumococcal 23 valent vaccine  0.5 mL Intramuscular Tomorrow-1000  . sodium chloride flush  3 mL Intravenous Once  . sodium chloride flush  3 mL Intravenous Q12H    Assessment: Patient is a CODE  STEMI was taken to cath for PCI, pt has h/o DVT w/ a h/o being on warfarin (patient's med rec has not been updated since 2012). Baseline aPTT was 82 seconds (prior to receiving any heparin). CBC/INR WNL. Patient was started on heparin drip for post-PCI management of STEMI.  Heparin Course: 2/09 initiation: 3650 unit bolus, then 750 units/hr 2/10 0721 aPTT>160s, HL 0.34: dec rate to 650 units/hr  2/10 1630 aPTT>160s, HL 0.21: dec rate to 550 units/hr  Goal of Therapy:  Heparin level 0.3-0.7 units/ml Monitor platelets by anticoagulation protocol: Yes   Plan:  02/11 @ 0200 aPTT 115 seconds, supratherapeutic. Will decrease rate to 450 units/hr and will recheck aPTT at 0800, h/h trending down, will continue to monitor.  Thomasene Ripple,  PharmD, BCPS Clinical Pharmacist 04/03/2019,2:38 AM

## 2019-04-03 NOTE — Progress Notes (Signed)
Progress Note  Patient Name: Molly Dickerson Date of Encounter: 04/03/2019  Primary Cardiologist: new to [facility] - admission by End  Subjective   She noted sharp intermittent chest pain overnight with recommendation for prn PO toradol. She did not require this overnight as she indicated her pain resolved. She was resting comfortably upon me entering the room, playing on her phone. Upon introducing myself, she became tearful and reported chest pain that was sharp.   Inpatient Medications    Scheduled Meds: . aspirin  81 mg Oral Daily  . atorvastatin  40 mg Oral q1800  . buprenorphine-naloxone  1 tablet Sublingual Q8H  . Chlorhexidine Gluconate Cloth  6 each Topical Daily  . clopidogrel  75 mg Oral Daily  . colchicine  0.6 mg Oral BID  . metoprolol tartrate  12.5 mg Oral BID  . nicotine  14 mg Transdermal Daily  . pneumococcal 23 valent vaccine  0.5 mL Intramuscular Tomorrow-1000  . sodium chloride flush  3 mL Intravenous Once  . sodium chloride flush  3 mL Intravenous Q12H   Continuous Infusions: . sodium chloride 20 mL/hr at 04/01/19 2233  . sodium chloride    . heparin 450 Units/hr (04/03/19 0259)   PRN Meds: sodium chloride, acetaminophen, ketorolac, ondansetron (ZOFRAN) IV, sodium chloride flush   Vital Signs    Vitals:   04/02/19 2007 04/03/19 0331 04/03/19 0756 04/03/19 0959  BP: 100/65 (!) 98/57 (!) 99/57 (!) 98/54  Pulse: 84 74 77 79  Resp: 16  15   Temp: 98.2 F (36.8 C) 98.1 F (36.7 C) 97.6 F (36.4 C)   TempSrc: Oral Oral Oral   SpO2: 98% 99% 99%   Weight:      Height:        Intake/Output Summary (Last 24 hours) at 04/03/2019 1021 Last data filed at 04/03/2019 0330 Gross per 24 hour  Intake 75.81 ml  Output 200 ml  Net -124.19 ml   Filed Weights   04/01/19 2213 04/02/19 1847  Weight: 61.2 kg 55.5 kg    Telemetry    SR - Personally Reviewed  ECG    No new tracings - Personally Reviewed  Physical Exam   GEN: No acute distress. Up  sitting in her bed playing on her phone upon entering the room. This was followed by her putting the phone down and beginning to cry stating she was having sharp chest pain that is worse with deep inspiration.  Neck: No JVD. Cardiac: RRR, no murmurs, rubs, or gallops. Right radial cardiac cath site is without bleeding, bruising, swelling, warmth, or erythema. Radial pulse 2+. Respiratory: Clear to auscultation bilaterally.  GI: Soft, nontender, non-distended.   MS: No edema; No deformity. Neuro:  Alert and oriented x 3; Nonfocal.  Psych: Normal affect.  Labs    Chemistry Recent Labs  Lab 04/01/19 2218 04/02/19 0721  NA 136 136  K 3.0* 4.4  CL 100 107  CO2 25 23  GLUCOSE 96 110*  BUN 11 9  CREATININE 0.87 0.69  CALCIUM 9.0 8.1*  PROT  --  6.9  ALBUMIN  --  3.4*  AST  --  39  ALT  --  11  ALKPHOS  --  64  BILITOT  --  1.6*  GFRNONAA >60 >60  GFRAA >60 >60  ANIONGAP 11 6     Hematology Recent Labs  Lab 04/01/19 2218 04/02/19 0721 04/03/19 0201  WBC 12.8* 9.2 6.2  RBC 4.68 3.56* 3.36*  HGB 15.1*  11.7* 11.0*  HCT 43.7 33.8* 32.4*  MCV 93.4 94.9 96.4  MCH 32.3 32.9 32.7  MCHC 34.6 34.6 34.0  RDW 11.9 11.9 12.0  PLT 150 132* 153    Cardiac EnzymesNo results for input(s): TROPONINI in the last 168 hours. No results for input(s): TROPIPOC in the last 168 hours.   BNPNo results for input(s): BNP, PROBNP in the last 168 hours.   DDimer No results for input(s): DDIMER in the last 168 hours.   Radiology   DG Chest Port 1 View  Result Date: 04/01/2019 IMPRESSION: No focal consolidation. Possible mild vascular congestion or viral infection. Clinical correlation is recommended. Electronically Signed   By: Anner Crete M.D.   On: 04/01/2019 22:39    Cardiac Studies   LHC 04/01/2019: Conclusions: 1. Single vessel coronary artery disease with occlusion of the apical LAD.  Otherwise, there is no angiographically significant coronary artery disease. 2. Apical  akinesis with otherwise hyperdynamic left ventricular systolic function. 3. Mildly elevated left ventricular filling pressure.  Recommendations: 1. Medical therapy, as apical LAD occlusion is too small/distal for percutaneous intervention (vessel diameter less than 2 mm).  We will titrate nitroglycerin infusion for relief of chest pain and start heparin infusion 2 hours after TR band has been removed (to be continued for 48 hours). 2. Dual antiplatelet therapy with aspirin and clopidogrel. 3. High intensity statin therapy. __________  2D echo 04/02/2019: 1. Left ventricular ejection fraction, by estimation, is 60 to 65%. The  left ventricle has normal function. The left ventrical demonstrates  regional wall motion abnormalities (see scoring diagram/findings for  description). There is mildly increased left  ventricular hypertrophy. Left ventricular diastolic parameters are  consistent with Grade II diastolic dysfunction (pseudonormalization). LV  inferoapical hypokinesis noted.  2. Right ventricular systolic function is normal. The right ventricular  size is normal. There is normal pulmonary artery systolic pressure.  3. No left atrial/left atrial appendage thrombus was detected.  4. The mitral valve is grossly normal. Mild mitral valve regurgitation.  5. The aortic valve is normal in structure and function. Aortic valve  regurgitation is not visualized.  6. The inferior vena cava is normal in size with greater than 50%  respiratory variability, suggesting right atrial pressure of 3 mmHg.   Patient Profile     39 y.o. female with history of postpartum DVT, nephrolithiasis, and opioid dependence on Suboxone who was admitted with with anterolateral and inferior STEMI.   Assessment & Plan    1. STEMI: -Emergent LHC on 2/10 showed an occluded apical LAD that wrapped around the apex and also supplied a portion of the inferior wall. Mechanism unclear, though possibly related to an  acute thrombotic occlusion with SCAD also possibly being a consideration. Unable to exclude hypercoagulable state given her history of DVT -Initially, it was recommended she complete 48 hours of heparin, however she has continued to have a down trending HGB this admission with continued increase in PTT despite lowering heparin rate per pharmacy protocol -Cannot exclude hemoconcentration upon admission and she may be closer to her baseline at this time  -After discussion with MD, we will stop her heparin gtt at this time with full dose Lovenox bid for 2 doses -Continue DAPT with ASA and Plavix -There is no evidence of a pericardial effusion on echo -Will discuss continuation of colchicine with MD -She appeared comfortable upon entering the room with noted chest pain shortly after -After discussion with MD, will hold off on CTA chest -Some degree  of pain may be related to chronic pain vs distal LAD occlusion -HS-Tn down trending, will add on another given reported chest pain   2. ICM: -Echo this admission demonstrated preserved LVSF with inferoapical hypokinesis consistent with LHC findings as above -Continue metoprolol as BP allows  3. HLD: -LDL of 121 this admission with goal LDL of < 70 -Titrate Lipitor to 80 mg daily -She will need a follow up FLP and ALT in ~ 8 weeks, if her LDL remains above goal at that time, add Zetia   4. Anemia/elevated PTT: -HGB has trended down from admission value of 15.1 to 11 this morning with continued elevations in PTT despite reducing heparin rate -Cannot exclude possible hemoconcentration upon admission with her being closer to baseline at this time -Stop heparin gtt as above -Consider hematology referral as an outpatient   5. Polysubstance abuse: -PTA Suboxone and follow up with prescribing provider   6. Hypokalemia: -Repleted   7. Leukocytosis: -Resolved and likely inflammatory in the setting of her MI  8. History of postpartum DVT: -Unable to  exclude underlying hypercoagulable state -Recommend she follow up with hematology as an outpatient   9. DVT PPX: -Lovenox as above  10. Tobacco use: -Nicotine patch -Complete cessation is advised     For questions or updates, please contact [facility] Please consult [email or web address] for contact info under Cardiology/STEMI.    Signed, Eula Listen, PA-C [facility] Pager: [phone] 04/03/2019, 10:21 AM

## 2019-04-03 NOTE — Progress Notes (Signed)
Paged Dr Eden Emms to inform him of chest pain on inhalation in patient. VSS. Patient exhibits no signs or symptoms of distress. Will continue to monitor and await orders.

## 2019-04-03 NOTE — Discharge Instructions (Signed)
Please review your medication list thoroughly as there are some new medications that have been prescribed during this hospital admission.  Contact our office for any questions regarding your cardiac medications.  We recommend you follow-up with your primary care physician within 2 weeks of hospital discharge as well.  You may want to discuss with them your prior diagnosis of blood clot and see if further testing is needed regarding this.  You will need to follow-up with your prescribing provider for your Suboxone. _______________   Coronary Artery Disease, Female Coronary artery disease (CAD) is a condition in which the arteries that lead to the heart (coronary arteries) become narrow or blocked. The narrowing or blockage can lead to decreased blood flow to the heart. Prolonged reduced blood flow can cause a heart attack (myocardial infarction or MI). This condition may also be called coronary heart disease. Because CAD is the leading cause of death in women, it is important to understand what causes this condition and how it is treated. What are the causes? CAD is most often caused by atherosclerosis. This is the buildup of fat and cholesterol (plaque) on the inside of the arteries. Over time, the plaque may narrow or block the artery, reducing blood flow to the heart. Plaque can also become weak and break off within a coronary artery and cause a sudden blockage. Other less common causes of CAD include:  A blood clot or a piece of a blood clot or other substance that blocks the flow of blood in a coronary artery (embolism).  A tearing of the artery (spontaneous coronary artery dissection).  An enlargement of an artery (aneurysm).  Inflammation (vasculitis) in the artery wall. What increases the risk? The following factors may make you more likely to develop this condition:  Age. Women over age 62 are at a greater risk of CAD.  Family history of CAD.  High blood pressure  (hypertension).  Diabetes.  High cholesterol levels.  Tobacco use.  Lack of exercise.  Menopause. ? All postmenopausal women are at greater risk of CAD. ? Women who have experienced menopause between the ages of 23-45 (early menopause) are at a higher risk of CAD. ? Women who have experienced menopause before age 54 (premature menopause) are at a very high risk of CAD.  Excessive alcohol use.  A diet high in saturated and trans fats, such as fried food and processed meat. Other possible risk factors include:  High stress levels.  Depression.  Obesity.  Sleep apnea. What are the signs or symptoms? Many people do not have any symptoms during the early stages of CAD. As the condition progresses, symptoms may include:  Chest pain (angina). The pain can: ? Feel like crushing or squeezing, or like a tightness, pressure, fullness, or heaviness in the chest. ? Last more than a few minutes or can stop and recur. The pain tends to get worse with exercise or stress and to fade with rest.  Pain in the arms, neck, jaw, ear, or back.  Unexplained heartburn or indigestion.  Shortness of breath.  Nausea.  Sudden cold sweats.  Sudden light-headedness.  Fluttering or fast heartbeat (palpitations). Many women have chest discomfort and the other symptoms. However, women often have unusual (atypical) symptoms, such as:  Fatigue.  Vomiting.  Unexplained feelings of nervousness or anxiety.  Unexplained weakness.  Dizziness or fainting. How is this diagnosed? This condition is diagnosed based on:  Your family and medical history.  A physical exam.  Tests, including: ?  A test to check the electrical signals in your heart (electrocardiogram). ? Exercise stress test. This looks for signs of blockage when the heart is stressed with exercise, such as running on a treadmill. ? Pharmacologic stress test. This test looks for signs of blockage when the heart is being stressed with  a medicine. ? Blood tests. ? Coronary angiogram. This is a procedure to look at the coronary arteries to see if there is any blockage. During this test, a dye is injected into your arteries so they appear on an X-ray. ? Coronary artery CT scan. This CT scan helps detect calcium deposits in your coronary arteries. Calcium deposits are an indicator of CAD. ? A test that uses sound waves to take a picture of your heart (echocardiogram). ? Chest X-ray. How is this treated? This condition may be treated by:  Healthy lifestyle changes to reduce risk factors.  Medicines such as: ? Antiplatelet medicines and blood-thinning medicines, such as aspirin. These help to prevent blood clots. ? Nitroglycerin. ? Blood pressure medicines. ? Cholesterol-lowering medicine.  Coronary angioplasty and stenting. During this procedure, a thin, flexible tube is inserted through a blood vessel and into a blocked artery. A balloon or similar device on the end of the tube is inflated to open up the artery. In some cases, a small, mesh tube (stent) is inserted into the artery to keep it open.  Coronary artery bypass surgery. During this surgery, veins or arteries from other parts of the body are used to create a bypass around the blockage and allow blood to reach your heart. Follow these instructions at home: Medicines  Take over-the-counter and prescription medicines only as told by your health care provider.  Do not take the following medicines unless your health care provider approves: ? NSAIDs, such as ibuprofen, naproxen, or celecoxib. ? Vitamin supplements that contain vitamin A, vitamin E, or both. ? Hormone replacement therapy that contains estrogen with or without progestin. Lifestyle  Follow an exercise program approved by your health care provider. Aim for 150 minutes of moderate exercise or 75 minutes of vigorous exercise each week.  Maintain a healthy weight or lose weight as approved by your health  care provider.  Learn to manage stress or try to limit your stress. Ask your health care provider for suggestions if you need help.  Get screened for depression and seek treatment, if needed.  Do not use any products that contain nicotine or tobacco, such as cigarettes, e-cigarettes, and chewing tobacco. If you need help quitting, ask your health care provider.  Do not use illegal drugs. Eating and drinking   Follow a heart-healthy diet. A dietitian can help educate you about healthy food options and changes. In general, eat plenty of fruits and vegetables, lean meats, and whole grains.  Avoid foods high in: ? Sugar. ? Salt (sodium). ? Saturated fats, such as processed or fatty meat. ? Trans fats, such as fried food.  Use healthy cooking methods such as roasting, grilling, broiling, baking, poaching, steaming, or stir-frying.  Do not drink alcohol if: ? Your health care provider tells you not to drink. ? You are pregnant, may be pregnant, or are planning to become pregnant.  If you drink alcohol: ? Limit how much you have to 0-1 drink a day. ? Be aware of how much alcohol is in your drink. In the [place] one drink equals one 12 oz bottle of beer (355 mL), one 5 oz glass of wine (148 mL), or one 1  oz glass of hard liquor (44 mL). General instructions  Manage any other health conditions, such as hypertension and diabetes. These conditions affect your heart.  Your health care provider may ask you to monitor your blood pressure. Ideally, your blood pressure should be below 130/80.  Keep all follow-up visits as told by your health care provider. This is important. Get help right away if:  You have pain in your chest, neck, ear, arm, jaw, stomach, or back that: ? Lasts more than a few minutes. ? Is recurring. ? Is not relieved by taking medicine under your tongue (sublingual nitroglycerin).  You have profuse sweating without cause.  You have unexplained: ? Heartburn or  indigestion. ? Shortness of breath or difficulty breathing. ? Fluttering or fast heartbeat (palpitations). ? Nausea or vomiting. ? Fatigue. ? Feelings of nervousness or anxiety. ? Weakness. ? Diarrhea.  You have sudden light-headedness or dizziness.  You faint.  You feel like hurting yourself or think about taking your own life. These symptoms may represent a serious problem that is an emergency. Do not wait to see if the symptoms will go away. Get medical help right away. Call your local emergency services (911 in the [place] Do not drive yourself to the hospital. Summary  Coronary artery disease (CAD) is a condition in which the arteries that lead to the heart (coronary arteries) become narrow or blocked. The narrowing or blockage can lead to a heart attack.  Many women have chest discomfort and other common symptoms of CAD. However, women often have unusual (atypical) symptoms, such as fatigue, vomiting, weakness, or dizziness.  CAD can be treated with lifestyle changes, medicines, surgery, or a combination of these treatments. This information is not intended to replace advice given to you by your health care provider. Make sure you discuss any questions you have with your health care provider. Document Revised: 10/26/2017 Document Reviewed: 10/16/2017 Elsevier Patient Education  2020 [employer] _______________   Radial Site Care  This sheet gives you information about how to care for yourself after your procedure. Your health care provider may also give you more specific instructions. If you have problems or questions, contact your health care provider. What can I expect after the procedure? After the procedure, it is common to have:  Bruising and tenderness at the catheter insertion area. Follow these instructions at home: Medicines  Take over-the-counter and prescription medicines only as told by your health care provider. Insertion site care  Follow instructions  from your health care provider about how to take care of your insertion site. Make sure you: ? Wash your hands with soap and water before you change your bandage (dressing). If soap and water are not available, use hand sanitizer. ? Change your dressing as told by your health care provider. ? Leave stitches (sutures), skin glue, or adhesive strips in place. These skin closures may need to stay in place for 2 weeks or longer. If adhesive strip edges start to loosen and curl up, you may trim the loose edges. Do not remove adhesive strips completely unless your health care provider tells you to do that.  Check your insertion site every day for signs of infection. Check for: ? Redness, swelling, or pain. ? Fluid or blood. ? Pus or a bad smell. ? Warmth.  Do not take baths, swim, or use a hot tub until your health care provider approves.  You may shower 24-48 hours after the procedure, or as directed by your health care provider. ?  Remove the dressing and gently wash the site with plain soap and water. ? Pat the area dry with a clean towel. ? Do not rub the site. That could cause bleeding.  Do not apply powder or lotion to the site. Activity   For 24 hours after the procedure, or as directed by your health care provider: ? Do not flex or bend the affected arm. ? Do not push or pull heavy objects with the affected arm. ? Do not drive yourself home from the hospital or clinic. You may drive 24 hours after the procedure unless your health care provider tells you not to. ? Do not operate machinery or power tools.  Do not lift anything that is heavier than 10 lb (4.5 kg), or the limit that you are told, until your health care provider says that it is safe.  Ask your health care provider when it is okay to: ? Return to work or school. ? Resume usual physical activities or sports. ? Resume sexual activity. General instructions  If the catheter site starts to bleed, raise your arm and put firm  pressure on the site. If the bleeding does not stop, get help right away. This is a medical emergency.  If you went home on the same day as your procedure, a responsible adult should be with you for the first 24 hours after you arrive home.  Keep all follow-up visits as told by your health care provider. This is important. Contact a health care provider if:  You have a fever.  You have redness, swelling, or yellow drainage around your insertion site. Get help right away if:  You have unusual pain at the radial site.  The catheter insertion area swells very fast.  The insertion area is bleeding, and the bleeding does not stop when you hold steady pressure on the area.  Your arm or hand becomes pale, cool, tingly, or numb. These symptoms may represent a serious problem that is an emergency. Do not wait to see if the symptoms will go away. Get medical help right away. Call your local emergency services (911 in the [place] Do not drive yourself to the hospital. Summary  After the procedure, it is common to have bruising and tenderness at the site.  Follow instructions from your health care provider about how to take care of your radial site wound. Check the wound every day for signs of infection.  Do not lift anything that is heavier than 10 lb (4.5 kg), or the limit that you are told, until your health care provider says that it is safe. This information is not intended to replace advice given to you by your health care provider. Make sure you discuss any questions you have with your health care provider. Document Revised: 03/14/2017 Document Reviewed: 03/14/2017 Elsevier Patient Education  2020 [employer]. _______________   Cardiac Rehabilitation What is cardiac rehabilitation? Cardiac rehabilitation is a treatment program that helps improve the health and well-being of people who have heart problems. Cardiac rehabilitation includes exercise training, education, and counseling to  help you get stronger and return to an active lifestyle. This program can help you get better faster and reduce any future hospital stays. Why might I need cardiac rehabilitation? Cardiac rehabilitation programs can help when you have or have had:  A heart attack.  Heart failure.  Peripheral artery disease.  Coronary artery disease.  Angina.  Lung or breathing problems. Cardiac rehabilitation programs are also used when you have had:  Coronary  artery bypass graft surgery.  Heart valve replacement.  Heart stent placement.  Heart transplant.  Aneurysm repair. What are the benefits of cardiac rehabilitation? Cardiac rehabilitation can help you:  Reduce problems like chest pain and trouble breathing.  Change risk factors that contribute to heart disease, such as: ? Smoking. ? High blood pressure. ? High cholesterol. ? Diabetes. ? Being inactive. ? Weighing over 30% more than your ideal weight. ? Diet.  Improve your emotional outlook so you feel: ? More hopeful. ? Better about yourself. ? More confident about taking care of yourself.  Get support from health experts as well as other people with similar problems.  Learn healthy ways to manage stress.  Learn how to manage and understand your medicines.  Teach your family about your condition and how to participate in your recovery. What happens in cardiac rehabilitation? You will be assessed by a cardiac rehabilitation team. They will check your health history and do a physical exam. You may need blood tests, exercise stress tests, and other evaluations to make sure that you are ready to start cardiac rehabilitation. The cardiac rehabilitation team works with you to make a plan based on your health and goals. Your program will be tailored to fit you and your needs and may change as you progress. You may work with a health care team that includes:  Doctors.  Nurses.  Dietitians.  Psychologists.  Exercise  specialists.  Physical and occupational therapists. What are the phases of cardiac rehabilitation? A cardiac rehabilitation program is often divided into phases. You advance from one phase to the next. Phase 1 This phase starts while you are still in the hospital. You may:  Start by walking in your room and then in the hall.  Do some simple exercises with a therapist.  Phase 2 This phase begins when you go home or to another facility. You will travel to a cardiac rehabilitation center or another place where rehabilitation is offered. This phase may last 8-12 weeks. During this phase:  You will slowly increase your activity level while being closely watched by a nurse or therapist.  You will have medical tests and exams to monitor your progress.  Your exercises may include strength or resistance training along with activities that cause your heart to beat faster (aerobic exercises), such as walking on a treadmill.  Your condition will determine how often and how long these sessions last.  You may learn how to: ? Lacinda Axon heart-healthy meals. ? Control your blood sugar, if this applies. ? Stop smoking. ? Manage your medicines. You may need help with scheduling or planning how and when to take your medicines. If you have questions about your medicines, it is very important that you talk with your health care provider.  Phase 3 This phase continues for the rest of your life. In this phase:  There will be less supervision.  You may continue to participate in cardiac rehabilitation activities or become part of a group in your community.  You may benefit from talking about your experience with other people who are facing similar challenges. Follow these instructions at home:  Take over-the-counter and prescription medicines only as told by your health care provider.  Keep all follow-up visits as told by your health care provider. This is important. Get help right away if:  You have  severe chest discomfort, especially if the pain is crushing or pressure-like and spreads to your arms, back, neck, or jaw. Do not wait to see if  the pain will go away.  You have weakness or numbness in your face, arms, or legs, especially on one side of the body.  Your speech is slurred.  You are confused.  You have a sudden, severe headache or loss of vision.  You have shortness of breath.  You are sweating and have nausea.  You feel dizzy or faint.  You are fatigued. These symptoms may represent a serious problem that is an emergency. Do not wait to see if the symptoms will go away. Get medical help right away. Call your local emergency services (911 in the [place] Do not drive yourself to the hospital. Summary  Cardiac rehabilitation is a treatment program that helps improve the health and well-being of people who have heart problems.  A cardiac rehabilitation program is often divided into phases. You advance from one phase to the next.  The cardiac rehabilitation team works with you to make a plan based on your health and goals.  Cardiac rehabilitation includes exercise training, education, and counseling to help you get stronger and return to an active lifestyle. This information is not intended to replace advice given to you by your health care provider. Make sure you discuss any questions you have with your health care provider. Document Revised: 05/29/2018 Document Reviewed: 12/06/2017 Elsevier Patient Education  2020 [employer].

## 2019-04-03 NOTE — Progress Notes (Signed)
Patient resting in bed with no signs or symptoms of distress will continue to monitor.

## 2019-04-03 NOTE — Telephone Encounter (Signed)
Patient currently admitted at this time. 

## 2019-04-03 NOTE — Progress Notes (Signed)
ANTICOAGULATION CONSULT NOTE  Pharmacy Consult for heparin Indication: chest pain/ACS  No Known Allergies  Patient Measurements: Height: 5' (152.4 cm) Weight: 122 lb 4.8 oz (55.5 kg) IBW/kg (Calculated) : 45.5 Heparin Dosing Weight: 69.4 kg  Vital Signs: Temp: 98.1 F (36.7 C) (02/11 0331) Temp Source: Oral (02/11 0331) BP: 98/57 (02/11 0331) Pulse Rate: 74 (02/11 0331)  Labs: Recent Labs    04/01/19 2218 04/01/19 2218 04/01/19 2227 04/02/19 0454 04/02/19 0721 04/02/19 0721 04/02/19 1006 04/02/19 1630 04/03/19 0201 04/03/19 0812  HGB 15.1*   < >  --   --  11.7*  --   --   --  11.0*  --   HCT 43.7  --   --   --  33.8*  --   --   --  32.4*  --   PLT 150  --   --   --  132*  --   --   --  153  --   APTT  --    < > 82*  --  >160*   < >  --  >160* 115* 94*  LABPROT  --   --  11.6  --   --   --   --   --   --   --   INR  --   --  0.9  --   --   --   --   --   --   --   HEPARINUNFRC  --   --   --   --  0.34  --   --  0.21* <0.10*  --   CREATININE 0.87  --   --   --  0.69  --   --   --   --   --   TROPONINIHS 6,484*   < >  --  3,600*  --   --  3,199* 2,165*  --   --    < > = values in this interval not displayed.    Estimated Creatinine Clearance: 74.5 mL/min (by C-G formula based on SCr of 0.69 mg/dL).   Medical History: Past Medical History:  Diagnosis Date  . Anxiety   . Chronic kidney disease   . Depression   . Drug addiction [facility])    h/o addiction to pain pills  . DVT (deep venous thrombosis) (HCC) 2010   post partum  . Kidney stones     Medications:  Scheduled:  . aspirin  81 mg Oral Daily  . atorvastatin  40 mg Oral q1800  . buprenorphine-naloxone  1 tablet Sublingual Q8H  . Chlorhexidine Gluconate Cloth  6 each Topical Daily  . clopidogrel  75 mg Oral Daily  . colchicine  0.6 mg Oral BID  . metoprolol tartrate  12.5 mg Oral BID  . nicotine  14 mg Transdermal Daily  . pneumococcal 23 valent vaccine  0.5 mL Intramuscular Tomorrow-1000  . sodium  chloride flush  3 mL Intravenous Once  . sodium chloride flush  3 mL Intravenous Q12H    Assessment: Patient is a CODE STEMI was taken to cath for PCI, pt has h/o DVT w/ a h/o being on warfarin (patient's med rec has not been updated since 2012). Baseline aPTT was 82 seconds (prior to receiving any heparin). CBC/INR WNL. Patient was started on heparin drip for post-PCI management of STEMI.  Heparin Course: 2/09 initiation: 3650 unit bolus, then 750 units/hr 2/10 0721 aPTT>160s, HL 0.34: dec rate to 650 units/hr  2/10 1630 aPTT>160s,  HL 0.21: dec rate to 550 units/hr 2/11 0200 aPTT 115s, HL < 0.1: decr rate to 450 units/hr 2/11 0812 aPTT 94 sec  Goal of Therapy:  Heparin level 0.3-0.7 units/ml Monitor platelets by anticoagulation protocol: Yes  Target aPTT 66-102 sec   Plan:  02/11 @ 0812 aPTT 94 seconds, therapeutic.   Discussed asynchronous levels with cardiology - will continue to monitor  Continue 450 units/hr and will recheck aPTT at 1400, h/h trending down, will continue to monitor.  Lu Duffel, PharmD, BCPS Clinical Pharmacist 04/03/2019 7:47 AM

## 2019-04-03 NOTE — Telephone Encounter (Signed)
TCM....  Patient is being discharged     They are scheduled to see End on 2/24 at 10   They were seen for STEMI  They need to be seen within 7-14 days     Please call

## 2019-04-04 ENCOUNTER — Other Ambulatory Visit: Payer: Self-pay

## 2019-04-04 DIAGNOSIS — F329 Major depressive disorder, single episode, unspecified: Secondary | ICD-10-CM

## 2019-04-04 DIAGNOSIS — E876 Hypokalemia: Secondary | ICD-10-CM

## 2019-04-04 DIAGNOSIS — F1120 Opioid dependence, uncomplicated: Secondary | ICD-10-CM | POA: Diagnosis present

## 2019-04-04 DIAGNOSIS — O871 Deep phlebothrombosis in the puerperium: Secondary | ICD-10-CM

## 2019-04-04 DIAGNOSIS — D72829 Elevated white blood cell count, unspecified: Secondary | ICD-10-CM

## 2019-04-04 DIAGNOSIS — R079 Chest pain, unspecified: Secondary | ICD-10-CM

## 2019-04-04 DIAGNOSIS — F1910 Other psychoactive substance abuse, uncomplicated: Secondary | ICD-10-CM | POA: Diagnosis present

## 2019-04-04 DIAGNOSIS — I2510 Atherosclerotic heart disease of native coronary artery without angina pectoris: Secondary | ICD-10-CM

## 2019-04-04 DIAGNOSIS — Z720 Tobacco use: Secondary | ICD-10-CM

## 2019-04-04 DIAGNOSIS — Z590 Homelessness: Secondary | ICD-10-CM

## 2019-04-04 DIAGNOSIS — D649 Anemia, unspecified: Secondary | ICD-10-CM | POA: Diagnosis present

## 2019-04-04 LAB — URINE DRUG SCREEN, QUALITATIVE (ARMC ONLY)
Amphetamines, Ur Screen: NOT DETECTED
Barbiturates, Ur Screen: NOT DETECTED
Benzodiazepine, Ur Scrn: NOT DETECTED
Cannabinoid 50 Ng, Ur ~~LOC~~: POSITIVE — AB
Cocaine Metabolite,Ur ~~LOC~~: NOT DETECTED
MDMA (Ecstasy)Ur Screen: NOT DETECTED
Methadone Scn, Ur: NOT DETECTED
Opiate, Ur Screen: NOT DETECTED
Phencyclidine (PCP) Ur S: NOT DETECTED
Tricyclic, Ur Screen: NOT DETECTED

## 2019-04-04 LAB — TROPONIN I (HIGH SENSITIVITY)
Troponin I (High Sensitivity): 1323 ng/L (ref <18)
Troponin I (High Sensitivity): 1166 ng/L (ref <18)

## 2019-04-04 LAB — CBC
HCT: 32.4 % — ABNORMAL LOW (ref 36.0–46.0)
Hemoglobin: 11.2 g/dL — ABNORMAL LOW (ref 12.0–15.0)
MCH: 33.1 pg (ref 26.0–34.0)
MCHC: 34.6 g/dL (ref 30.0–36.0)
MCV: 95.9 fL (ref 80.0–100.0)
Platelets: 193 10*3/uL (ref 150–400)
RBC: 3.38 MIL/uL — ABNORMAL LOW (ref 3.87–5.11)
RDW: 11.6 % (ref 11.5–15.5)
WBC: 10.1 10*3/uL (ref 4.0–10.5)
nRBC: 0 % (ref 0.0–0.2)

## 2019-04-04 LAB — BASIC METABOLIC PANEL
Anion gap: 7 (ref 5–15)
BUN: 13 mg/dL (ref 6–20)
CO2: 22 mmol/L (ref 22–32)
Calcium: 8.6 mg/dL — ABNORMAL LOW (ref 8.9–10.3)
Chloride: 107 mmol/L (ref 98–111)
Creatinine, Ser: 0.88 mg/dL (ref 0.44–1.00)
GFR calc Af Amer: >60 mL/min (ref >60)
GFR calc non Af Amer: >60 mL/min (ref >60)
Glucose, Bld: 127 mg/dL — ABNORMAL HIGH (ref 70–99)
Potassium: 4.3 mmol/L (ref 3.5–5.1)
Sodium: 136 mmol/L (ref 135–145)

## 2019-04-04 LAB — SEDIMENTATION RATE: Sed Rate: 64 mm/hr — ABNORMAL HIGH (ref 0–20)

## 2019-04-04 LAB — C-REACTIVE PROTEIN: CRP: 12.2 mg/dL — ABNORMAL HIGH (ref <1.0)

## 2019-04-04 MED ORDER — COLCHICINE 0.6 MG PO TABS: 0.6000 mg | ORAL_TABLET | Freq: Two times a day (BID) | ORAL | 2 refills | Status: DC

## 2019-04-04 MED ORDER — METOPROLOL TARTRATE 25 MG PO TABS: 12.5000 mg | ORAL_TABLET | Freq: Two times a day (BID) | ORAL | 0 refills | Status: AC

## 2019-04-04 MED ORDER — ENOXAPARIN SODIUM 40 MG/0.4ML ~~LOC~~ SOLN: 40.0000 mg | SUBCUTANEOUS | Status: DC

## 2019-04-04 MED ORDER — METOPROLOL TARTRATE 5 MG/5ML IV SOLN
5.0000 mg | Freq: Once | INTRAVENOUS | Status: AC
  Administered 2019-04-04: 5 mg via INTRAVENOUS
  Filled 2019-04-04: qty 5

## 2019-04-04 MED ORDER — ASPIRIN 81 MG PO CHEW: 81.0000 mg | CHEWABLE_TABLET | Freq: Every day | ORAL | 11 refills | Status: AC

## 2019-04-04 MED ORDER — KETOROLAC TROMETHAMINE 10 MG PO TABS: 10.0000 mg | ORAL_TABLET | Freq: Three times a day (TID) | ORAL | 0 refills | Status: DC | PRN

## 2019-04-04 MED ORDER — NITROGLYCERIN 0.4 MG SL SUBL: 0.4000 mg | SUBLINGUAL_TABLET | SUBLINGUAL | Status: DC | PRN

## 2019-04-04 MED ORDER — ATORVASTATIN CALCIUM 80 MG PO TABS: 80.0000 mg | ORAL_TABLET | Freq: Every day | ORAL | Status: DC

## 2019-04-04 MED ORDER — CLOPIDOGREL BISULFATE 75 MG PO TABS: 75.0000 mg | ORAL_TABLET | Freq: Every day | ORAL | 11 refills | Status: AC

## 2019-04-04 MED ORDER — MORPHINE SULFATE (PF) 2 MG/ML IV SOLN: 1.0000 mg | INTRAVENOUS | Status: DC | PRN

## 2019-04-04 MED ORDER — ATORVASTATIN CALCIUM 80 MG PO TABS: 80.0000 mg | ORAL_TABLET | Freq: Every day | ORAL | 0 refills | Status: AC

## 2019-04-04 MED ORDER — NICOTINE 14 MG/24HR TD PT24: 14.0000 mg | MEDICATED_PATCH | Freq: Every day | TRANSDERMAL | 0 refills | Status: AC

## 2019-04-04 MED ORDER — KETOROLAC TROMETHAMINE 30 MG/ML IJ SOLN
30.0000 mg | Freq: Once | INTRAMUSCULAR | Status: AC
  Administered 2019-04-04: 30 mg via INTRAMUSCULAR
  Filled 2019-04-04: qty 1

## 2019-04-04 NOTE — Progress Notes (Signed)
Pt ambulated in hallway, while walking with patient she stated she and her "husband" jeremy live in their car and that he is a day worker and isn;t working at this time and she is also unemployed and she cannot afford any medications. I spoke with her about shelters available and she stated she is afraid to go to them because of covid, I encouraged the patient by stating that they have proper precautions in place and that this would be her best option for her after discharge. Pt also stated the address on file is her father and she is unable to go there as "it's complicated."    1600:Upon discharge, pt verbalized understanding and appeared eager to learn about discharge instructions

## 2019-04-04 NOTE — Clinical Social Work Note (Signed)
Pt lives in her care with her husband. Pt was provided with a list of resources in [place], including shelters at bedside. Pt did not engage in much conversation. Pt seemed very sleepy.  [place], [place] [phone]

## 2019-04-04 NOTE — Progress Notes (Signed)
Patient ID: Molly Dickerson, female   DOB: 12-30-1980, 39 y.o.   MRN: [id number]  Cardiology Attending  Called about 10/10 chest pain. Review of her ECG's reveals no significant change in her ST elevation from her ECG on 2/11. Review of her chart reveals apical LAD occlusion which corresponds to her current ECG findings. Her sbp is 106. I will give toradol and IV lopressor. If her pain continues she will need to be transferred back to the ICU for IV NTG and heparin and MSO4.   [name]

## 2019-04-04 NOTE — Progress Notes (Signed)
Progress Note  Patient Name: Molly Dickerson Date of Encounter: 04/04/2019  Primary Cardiologist: new to [facility] - admission by End  Subjective   Patient reports having chest pain overnight Symptoms improved with Toradol Cardiac enzymes checked this morning, they continue to trend down, no significant EKG changes noted Resting comfortably on exam this morning  Discussed social situation, reports that she lives in her car with her " husband" New finding marijuana positive this morning which was not present on her admission. " Husband" visited her last night  Inpatient Medications    Scheduled Meds: . aspirin  81 mg Oral Daily  . atorvastatin  40 mg Oral q1800  . buprenorphine-naloxone  1 tablet Sublingual Q8H  . Chlorhexidine Gluconate Cloth  6 each Topical Daily  . clopidogrel  75 mg Oral Daily  . colchicine  0.6 mg Oral BID  . enoxaparin (LOVENOX) injection  40 mg Subcutaneous Q24H  . metoprolol tartrate  12.5 mg Oral BID  . nicotine  14 mg Transdermal Daily  . pneumococcal 23 valent vaccine  0.5 mL Intramuscular Tomorrow-1000  . sodium chloride flush  3 mL Intravenous Once  . sodium chloride flush  3 mL Intravenous Q12H   Continuous Infusions: . sodium chloride 20 mL/hr at 04/01/19 2233  . sodium chloride     PRN Meds: sodium chloride, acetaminophen, ketorolac, ondansetron (ZOFRAN) IV, sodium chloride flush   Vital Signs    Vitals:   04/04/19 0250 04/04/19 0259 04/04/19 0324 04/04/19 0816  BP: (!) 106/58 (!) 105/57 (!) 98/56 (!) 96/52  Pulse: 93 85 84 68  Resp:   19 19  Temp:   98.9 F (37.2 C) 98.7 F (37.1 C)  TempSrc:   Oral Oral  SpO2:   95% 96%  Weight:      Height:        Intake/Output Summary (Last 24 hours) at 04/04/2019 1051 Last data filed at 04/04/2019 1017 Gross per 24 hour  Intake 336.81 ml  Output 150 ml  Net 186.81 ml   Filed Weights   04/01/19 2213 04/02/19 1847  Weight: 61.2 kg 55.5 kg    Telemetry    SR - Personally  Reviewed  ECG    No new tracings - Personally Reviewed  Physical Exam   Constitutional:  oriented to person, place, and time. No distress.  HENT:  Head: Grossly normal Eyes:  no discharge. No scleral icterus.  Neck: No JVD, no carotid bruits  Cardiovascular: Regular rate and rhythm, no murmurs appreciated Pulmonary/Chest: Clear to auscultation bilaterally, no wheezes or rails Abdominal: Soft.  no distension.  no tenderness.  Musculoskeletal: Normal range of motion Neurological:  normal muscle tone. Coordination normal. No atrophy Skin: Skin warm and dry Psychiatric: Flat affect, depressed   Labs    Chemistry Recent Labs  Lab 04/02/19 0721 04/03/19 1104 04/04/19 0551  NA 136 138 136  K 4.4 4.0 4.3  CL 107 107 107  CO2 23 22 22   GLUCOSE 110* 119* 127*  BUN 9 7 13   CREATININE 0.69 0.84 0.88  CALCIUM 8.1* 8.4* 8.6*  PROT 6.9  --   --   ALBUMIN 3.4*  --   --   AST 39  --   --   ALT 11  --   --   ALKPHOS 64  --   --   BILITOT 1.6*  --   --   GFRNONAA >60 >60 >60  GFRAA >60 >60 >60  ANIONGAP 6 9 7  Hematology Recent Labs  Lab 04/02/19 0721 04/03/19 0201 04/04/19 0551  WBC 9.2 6.2 10.1  RBC 3.56* 3.36* 3.38*  HGB 11.7* 11.0* 11.2*  HCT 33.8* 32.4* 32.4*  MCV 94.9 96.4 95.9  MCH 32.9 32.7 33.1  MCHC 34.6 34.0 34.6  RDW 11.9 12.0 11.6  PLT 132* 153 193    Cardiac EnzymesNo results for input(s): TROPONINI in the last 168 hours. No results for input(s): TROPIPOC in the last 168 hours.   BNPNo results for input(s): BNP, PROBNP in the last 168 hours.   DDimer No results for input(s): DDIMER in the last 168 hours.   Radiology   DG Chest Port 1 View  Result Date: 04/01/2019 IMPRESSION: No focal consolidation. Possible mild vascular congestion or viral infection. Clinical correlation is recommended. Electronically Signed   By: Elgie Collard M.D.   On: 04/01/2019 22:39    Cardiac Studies   LHC 04/01/2019: Conclusions: 1. Single vessel coronary  artery disease with occlusion of the apical LAD.  Otherwise, there is no angiographically significant coronary artery disease. 2. Apical akinesis with otherwise hyperdynamic left ventricular systolic function. 3. Mildly elevated left ventricular filling pressure.  Recommendations: 1. Medical therapy, as apical LAD occlusion is too small/distal for percutaneous intervention (vessel diameter less than 2 mm).  We will titrate nitroglycerin infusion for relief of chest pain and start heparin infusion 2 hours after TR band has been removed (to be continued for 48 hours). 2. Dual antiplatelet therapy with aspirin and clopidogrel. 3. High intensity statin therapy. __________  2D echo 04/02/2019: 1. Left ventricular ejection fraction, by estimation, is 60 to 65%. The  left ventricle has normal function. The left ventrical demonstrates  regional wall motion abnormalities (see scoring diagram/findings for  description). There is mildly increased left  ventricular hypertrophy. Left ventricular diastolic parameters are  consistent with Grade II diastolic dysfunction (pseudonormalization). LV  inferoapical hypokinesis noted.  2. Right ventricular systolic function is normal. The right ventricular  size is normal. There is normal pulmonary artery systolic pressure.  3. No left atrial/left atrial appendage thrombus was detected.  4. The mitral valve is grossly normal. Mild mitral valve regurgitation.  5. The aortic valve is normal in structure and function. Aortic valve  regurgitation is not visualized.  6. The inferior vena cava is normal in size with greater than 50%  respiratory variability, suggesting right atrial pressure of 3 mmHg.   Patient Profile     39 y.o. female with history of postpartum DVT, nephrolithiasis, and opioid dependence on Suboxone who was admitted with with anterolateral and inferior STEMI.   Assessment & Plan    1. STEMI: -Emergent LHC on 2/10 showed an occluded  apical LAD that wrapped around the apex and also supplied a portion of the inferior wall. -Positive urine tox screen on arrival for amphetamines, opiates, benzodiazepines.  She did receive some pain medication in the ER on arrival --Echocardiogram with inferoapical hypokinesis consistent with LHC findings  -She has completed antiplatelet therapy 48 hours -We will continue on aspirin with Plavix, statin, low-dose beta-blocker  Chest pain Atypical in nature, unable to exclude myopericarditis Appears she has been started on colchicine around midnight last night Also receiving Toradol with good effect of her symptoms  3. HLD: --Continue Lipitor Goal LDL less than 70  4. Polysubstance abuse: -PTA Suboxone and follow up with prescribing provider  --New finding marijuana positive this morning that was not there on arrival Visited by " husband" last night per nursing  5.  Tobacco use: -Nicotine patch -Complete cessation is advised    Total encounter time more than 25 minutes  Greater than 50% was spent in counseling and coordination of care with the patient   For questions or updates, please contact [facility] Please consult [email or web address] for contact info under Cardiology/STEMI.  Signed, Dossie Arbour, MD, Ph.D [facility]

## 2019-04-04 NOTE — Plan of Care (Signed)

## 2019-04-04 NOTE — Discharge Summary (Signed)
Discharge Summary    Patient ID: Molly Dickerson MRN: [id number]; DOB: [birth year]  Admit date: 04/01/2019 Discharge date: 04/04/2019  Primary Care Provider: Patient, No Pcp Per  Primary Cardiologist: Yvonne Kendall, MD  Primary Electrophysiologist:  None   Discharge Diagnoses    Principal Problem:   STEMI involving left anterior descending coronary artery [facility]) Active Problems:   CAD in native artery   Substance abuse (HCC)   Opioid dependence (HCC)   Hypokalemia   Tobacco use   Anemia   Leukocytosis   Deep vein thrombosis (DVT), postpartum    Diagnostic Studies/Procedures    LHC 04/01/2019: Conclusions: 1. Single vessel coronary artery disease with occlusion of the apical LAD.  Otherwise, there is no angiographically significant coronary artery disease. 2. Apical akinesis with otherwise hyperdynamic left ventricular systolic function. 3. Mildly elevated left ventricular filling pressure.  Recommendations: 1. Medical therapy, as apical LAD occlusion is too small/distal for percutaneous intervention (vessel diameter less than 2 mm).  We will titrate nitroglycerin infusion for relief of chest pain and start heparin infusion 2 hours after TR band has been removed (to be continued for 48 hours). 2. Dual antiplatelet therapy with aspirin and clopidogrel. 3. High intensity statin therapy. __________  Diagnostic Dominance: Right Left Main  Vessel is large. Vessel is angiographically normal.  Left Anterior Descending  Vessel is large. The vessel exhibits minimal luminal irregularities.  Dist LAD lesion 100% stenosed  Dist LAD lesion is 100% stenosed. Vessel is the culprit lesion.  First Diagonal Branch  Vessel is moderate in size.  Second Diagonal Branch  Vessel is moderate in size.  Third Diagonal Branch  Vessel is small in size.  Left Circumflex  Vessel is large. Vessel is angiographically normal.  First Obtuse Marginal Branch  Vessel is small in size.  Second  Obtuse Marginal Branch  Vessel is large in size.  Third Obtuse Marginal Branch  Vessel is small in size.  Right Coronary Artery  Vessel is large. Vessel is angiographically normal.  Right Posterior Descending Artery  Vessel is moderate in size.  Right Posterior Atrioventricular Artery  Vessel is moderate in size.  Intervention  No interventions have been documented. Wall Motion  Resting               Left Heart  Left Ventricle The left ventricular size is normal. The left ventricular systolic function is normal. LV end diastolic pressure is mildly elevated. LVEDP 20-25 mmHg. The left ventricular ejection fraction is 55-65% by visual estimate. There are LV function abnormalities due to segmental dysfunction.  Aortic Valve There is no aortic valve stenosis.  Coronary Diagrams  Diagnostic Dominance: Right   _____________    2D echo 04/02/2019: 1. Left ventricular ejection fraction, by estimation, is 60 to 65%. The  left ventricle has normal function. The left ventrical demonstrates  regional wall motion abnormalities (see scoring diagram/findings for  description). There is mildly increased left  ventricular hypertrophy. Left ventricular diastolic parameters are  consistent with Grade II diastolic dysfunction (pseudonormalization). LV  inferoapical hypokinesis noted.  2. Right ventricular systolic function is normal. The right ventricular  size is normal. There is normal pulmonary artery systolic pressure.  3. No left atrial/left atrial appendage thrombus was detected.  4. The mitral valve is grossly normal. Mild mitral valve regurgitation.  5. The aortic valve is normal in structure and function. Aortic valve  regurgitation is not visualized.  6. The inferior vena cava is normal in size with greater than 50%  respiratory variability, suggesting right atrial pressure of 3 mmHg.    History of Present Illness     Molly Dickerson is a 39 y.o. female with  postpartum DVT, nephrolithiasis, and opioid dependence on Suboxone who was admitted to [facility] with acute onset of chest pain and EKG concerning for anterolateral/inferior ST elevation MI.   Patient is without any previously known cardiac history. She was in her usual state of health until she awoke from a nap around 7 PM on 04/01/2019 with severe central chest pain radiating to her neck, jaw, and back. Pain worsened with deep inspiration and improved somewhat with sitting up. There was associated dyspnea and nausea.    Hospital Course     Consultants: cardiac rehab, care management, social work, pharmacy    Upon her arrival to the ED she was found to have subtle anterolateral ST segment elevation. A limited bedside echo performed by Dr. Okey Dupre was notable for apical hypokinesis with otherwise vigorous LV contraction with no pericardial effusion being noted. RV size and function were grossly normal. She received aspirin, sublingual nitroglycerin, and 4000 units of heparin  STEMI:  She underwent emergent cardiac cath in the early morning hours of 2/10 which revealed occlusion of the apical LAD with otherwise no significant CAD. LV gram showed focal apical akinesis consistent with occlusion of the apical LAD. Her initial high-sensitivity troponin of 6484 was also the peak troponin with subsequent values downtrending throughout her hospitalization. Post procedure, she reported 7 out of 10 chest pain leading to initiation of nitro drip which was subsequently tapered off on the morning of 2/10. At time of admission, her Suboxone was held and she was given as needed morphine. DAPT was recommended with aspirin and Plavix. Initially, plans were for her to be treated with IV heparin for 48 hours. However, she was noted to have a downtrending hemoglobin without complaints of melena or hematochezia along with an elevated PTT despite reduction in heparin rate. Given this, heparin was ultimately discontinued  on 2/11 and she was treated with full dose Lovenox x2 doses per rounding MD. She was also treated for possible pericarditis with empiric colchicine. 2D echo 04/02/2019 showed an EF of 60 to 65%, grade 2 diastolic dysfunction, inferior apical LV hypokinesis, normal RV systolic function and cavity size, mild mitral regurgitation. She requested to be restarted on PTA Suboxone on the evening of 2/10. She continued to note intermittent atypical chest pain at times though denied as needed Toradol. Overnight, heading into 2/12, she again reported chest pain with EKG being unchanged from prior and HS-Tn continuing to show downward trend. She was given IV morphine per overnight covering staff. On the morning of 2/12, she denies any chest pain. Sed rate elevated at 64She was referred to cardiac rehab. Post-cath instructions.   ICM:  Echo during admission demonstrated preserved LV systolic function with inferior apical hypokinesis which was consistent with angiographic findings on LHC.   Substance abuse:  Admission drug screen positive for opioids, benzodiazepines, and amphetamines. Patient did have Adderall listed as a prior medication, though indicated she was not taking this medication. Following a visit from her boyfriend on 2/11, the nursing staff noted the patient was quite sedated with concern for potential substance use. Repeat drug screen on the morning of 2/12 showed the patient was now positive for cannabinoid. Upon the patient being told she was being discharged, she indicated to [place] that she was homeless. She has a permanent address listed in Epic. We consulted social  work and care management to assist with this. Due to the above drug screen findings, the patient does not qualify for placement. She was given a list of shelters at time of discharge. She will be discharged on PTA Suboxone with recommendation to follow-up with prescribing provider. Care management was consulted to assist with setting the  patient up with Medication Management. All discharge medications were sent to Medication Management per discussion with care manager.  She has indicated she is aware of all the local shelters, though chooses to avoid them in the setting of COVID. She indicates she cannot go live with her father (address on file in Epic).    HLD:  LDL of 121 this admission with goal LDL being less than 70. She was started on atorvastatin 40 mg during admission which was subsequently titrated to 80 mg daily on 2/11. She will need a follow-up fasting lipid panel and ALT in approximately 8 weeks. If her LDL remains above goal at that time recommend addition of Zetia 10 mg daily.   Anemia/elevated PTT:  Case was discussed with pharmacy with unclear etiology of continued elevations in PTT despite dose reducing heparin drip. It appeared her initial hemoglobin of 15.1 may have been hemoconcentrated with subsequent values more so consistent with her baseline hemoglobins noted in Epic. She denied any melena or hematochezia. HGB stable at discharge. It is recommended she discuss possible hematology referral with her primary care physician in the outpatient setting.   Hypokalemia:  Repleted and resolved.   Leukocytosis:  No sign of infection during admission. Likely inflammatory. Resolved.   History of postpartum DVT:  Patient should discuss possible hypercoagulable work-up with her PCP in the outpatient setting.   Tobacco use:  Nicotine patch during admission. At time of discharge complete cessation is recommended.   Otalgia:  She complained of ear pain on 2/12 with recommendation to follow up with her PCP.     Did the patient have an acute coronary syndrome (MI, NSTEMI, STEMI, etc) this admission?:  Yes                               AHA/ACC Clinical Performance & Quality Measures: 4. Aspirin prescribed? - Yes 5. ADP Receptor Inhibitor (Plavix/Clopidogrel, Brilinta/Ticagrelor or Effient/Prasugrel)  prescribed (includes medically managed patients)? - Yes 6. Beta Blocker prescribed? - Yes 7. High Intensity Statin (Lipitor 40-80mg  or Crestor 20-40mg ) prescribed? - Yes 8. EF assessed during THIS hospitalization? - Yes 9. For EF <40%, was ACEI/ARB prescribed? - Not Applicable (EF >/= 40%) 10. For EF <40%, Aldosterone Antagonist (Spironolactone or Eplerenone) prescribed? - Not Applicable (EF >/= 40%) 11. Cardiac Rehab Phase II ordered (Included Medically managed Patients)? - Yes   _____________  Discharge Vitals Blood pressure 101/65, pulse 77, temperature 98.5 F (36.9 C), temperature source Oral, resp. rate 16, height 5' (1.524 m), weight 55.5 kg, last menstrual period 03/29/2019, SpO2 100 %, unknown if currently breastfeeding.  Filed Weights   04/01/19 2213 04/02/19 1847  Weight: 61.2 kg 55.5 kg    See rounding MD note from today for physical exam.   Labs & Radiologic Studies    CBC Recent Labs    04/03/19 0201 04/04/19 0551  WBC 6.2 10.1  HGB 11.0* 11.2*  HCT 32.4* 32.4*  MCV 96.4 95.9  PLT 153 193   Basic Metabolic Panel Recent Labs    40/98/11 1104 04/04/19 0551  NA 138 136  K 4.0 4.3  CL 107 107  CO2 22 22  GLUCOSE 119* 127*  BUN 7 13  CREATININE 0.84 0.88  CALCIUM 8.4* 8.6*   Liver Function Tests Recent Labs    04/02/19 0721  AST 39  ALT 11  ALKPHOS 64  BILITOT 1.6*  PROT 6.9  ALBUMIN 3.4*   No results for input(s): LIPASE, AMYLASE in the last 72 hours. High Sensitivity Troponin:   Recent Labs  Lab 04/02/19 1006 04/02/19 1630 04/03/19 1104 04/04/19 0551 04/04/19 0824  TROPONINIHS 3,199* 2,165* 1,834* 1,323* 1,166*    BNP Invalid input(s): POCBNP D-Dimer No results for input(s): DDIMER in the last 72 hours. Hemoglobin A1C Recent Labs    04/01/19 2227  HGBA1C 4.6*   Fasting Lipid Panel Recent Labs    04/01/19 2227  CHOL 209*  HDL 57  LDLCALC 121*  TRIG 156*  CHOLHDL 3.7   Thyroid Function Tests No results for input(s):  TSH, T4TOTAL, T3FREE, THYROIDAB in the last 72 hours.  Invalid input(s): FREET3 _____________   [facility] Chest Port 1 View  Result Date: 04/01/2019 IMPRESSION: No focal consolidation. Possible mild vascular congestion or viral infection. Clinical correlation is recommended. Electronically Signed   By: Anner Crete M.D.   On: 04/01/2019 22:39    Disposition   Pt is being discharged home today in good condition.  Follow-up Plans & Appointments    Follow-up Information    End, Harrell Gave, MD Follow up on 04/16/2019.   Specialty: Cardiology Why: Appointment time: 10:00 AM Contact information: [place]  [id number] [phone]          Discharge Instructions    Diet - low sodium heart healthy   Complete by: As directed    Increase activity slowly   Complete by: As directed    PHASE II - CARDIAC REHAB   Complete by: As directed       Discharge Medications   Allergies as of 04/04/2019   No Known Allergies     Medication List    TAKE these medications   aspirin 81 MG chewable tablet Chew 1 tablet (81 mg total) by mouth daily. Start taking on: April 05, 2019   atorvastatin 80 MG tablet Commonly known as: LIPITOR Take 1 tablet (80 mg total) by mouth daily at 6 PM.   buprenorphine-naloxone 8-2 mg Subl SL tablet Commonly known as: SUBOXONE Place 1 tablet under the tongue 3 (three) times daily.   clopidogrel 75 MG tablet Commonly known as: PLAVIX Take 1 tablet (75 mg total) by mouth daily. Start taking on: April 05, 2019   colchicine 0.6 MG tablet Take 1 tablet (0.6 mg total) by mouth 2 (two) times daily.   ketorolac 10 MG tablet Commonly known as: TORADOL Take 1 tablet (10 mg total) by mouth every 8 (eight) hours as needed for moderate pain (chest pain).   metoprolol tartrate 25 MG tablet Commonly known as: LOPRESSOR Take 0.5 tablets (12.5 mg total) by mouth 2 (two) times daily.   nicotine 14 mg/24hr patch Commonly  known as: NICODERM CQ - dosed in mg/24 hours Place 1 patch (14 mg total) onto the skin daily. Start taking on: April 05, 2019          Outstanding Labs/Studies   None.   Duration of Discharge Encounter   Greater than 30 minutes including physician time.  Signed, Christell Faith, PA-C 04/04/2019, 2:37 PM

## 2019-04-04 NOTE — Progress Notes (Signed)
Pt c/o 10/10 pain in her chest. MD Ladona Ridgel made aware. Pt given 30 mg IM Toradol, and an EKG done. Pain better and pt is calmer. 5 Mg IV lopressor given over 5 minutes per MD. Pt educated on plan of care. Will continue to monitor

## 2019-04-08 NOTE — Telephone Encounter (Signed)
1st attempt at [facility]. No answer. Left message to call back.

## 2019-04-09 NOTE — Telephone Encounter (Signed)
2nd attempt TCM. No answer. Left message to call back.

## 2019-04-10 NOTE — Telephone Encounter (Signed)
3rd TCM attempt. No answer. Left message to call back if any questions pertaining to discharge from hospital of follow up appointment.

## 2019-04-13 ENCOUNTER — Other Ambulatory Visit: Payer: Self-pay

## 2019-04-13 ENCOUNTER — Inpatient Hospital Stay: Payer: Self-pay

## 2019-04-13 ENCOUNTER — Emergency Department: Payer: Self-pay

## 2019-04-13 ENCOUNTER — Inpatient Hospital Stay (HOSPITAL_COMMUNITY)
Admit: 2019-04-13 | Discharge: 2019-04-13 | Disposition: A | Discharge status: Home / Self Care | Payer: Self-pay | Attending: Cardiovascular Disease | Admitting: Cardiovascular Disease

## 2019-04-13 ENCOUNTER — Encounter: Payer: Self-pay | Admitting: Emergency Medicine

## 2019-04-13 ENCOUNTER — Observation Stay
Admission: EM | Admit: 2019-04-13 | Discharge: 2019-04-14 | Disposition: A | Discharge status: Home / Self Care | Payer: Self-pay | Attending: Internal Medicine | Admitting: Internal Medicine

## 2019-04-13 DIAGNOSIS — I213 ST elevation (STEMI) myocardial infarction of unspecified site: Secondary | ICD-10-CM | POA: Insufficient documentation

## 2019-04-13 DIAGNOSIS — Z7982 Long term (current) use of aspirin: Secondary | ICD-10-CM | POA: Insufficient documentation

## 2019-04-13 DIAGNOSIS — I2510 Atherosclerotic heart disease of native coronary artery without angina pectoris: Secondary | ICD-10-CM | POA: Diagnosis present

## 2019-04-13 DIAGNOSIS — Z86718 Personal history of other venous thrombosis and embolism: Secondary | ICD-10-CM | POA: Insufficient documentation

## 2019-04-13 DIAGNOSIS — H9202 Otalgia, left ear: Secondary | ICD-10-CM | POA: Insufficient documentation

## 2019-04-13 DIAGNOSIS — Z7902 Long term (current) use of antithrombotics/antiplatelets: Secondary | ICD-10-CM | POA: Insufficient documentation

## 2019-04-13 DIAGNOSIS — Z79899 Other long term (current) drug therapy: Secondary | ICD-10-CM | POA: Insufficient documentation

## 2019-04-13 DIAGNOSIS — I952 Hypotension due to drugs: Secondary | ICD-10-CM

## 2019-04-13 DIAGNOSIS — Z20822 Contact with and (suspected) exposure to covid-19: Secondary | ICD-10-CM | POA: Insufficient documentation

## 2019-04-13 DIAGNOSIS — I241 Dressler's syndrome: Principal | ICD-10-CM | POA: Insufficient documentation

## 2019-04-13 DIAGNOSIS — I959 Hypotension, unspecified: Secondary | ICD-10-CM | POA: Insufficient documentation

## 2019-04-13 DIAGNOSIS — I319 Disease of pericardium, unspecified: Secondary | ICD-10-CM | POA: Diagnosis present

## 2019-04-13 DIAGNOSIS — D72829 Elevated white blood cell count, unspecified: Secondary | ICD-10-CM | POA: Diagnosis present

## 2019-04-13 DIAGNOSIS — I200 Unstable angina: Secondary | ICD-10-CM

## 2019-04-13 DIAGNOSIS — E785 Hyperlipidemia, unspecified: Secondary | ICD-10-CM | POA: Insufficient documentation

## 2019-04-13 DIAGNOSIS — F329 Major depressive disorder, single episode, unspecified: Secondary | ICD-10-CM | POA: Insufficient documentation

## 2019-04-13 DIAGNOSIS — F1910 Other psychoactive substance abuse, uncomplicated: Secondary | ICD-10-CM | POA: Diagnosis present

## 2019-04-13 DIAGNOSIS — D649 Anemia, unspecified: Secondary | ICD-10-CM

## 2019-04-13 DIAGNOSIS — F1120 Opioid dependence, uncomplicated: Secondary | ICD-10-CM | POA: Diagnosis present

## 2019-04-13 DIAGNOSIS — I252 Old myocardial infarction: Secondary | ICD-10-CM

## 2019-04-13 DIAGNOSIS — F17210 Nicotine dependence, cigarettes, uncomplicated: Secondary | ICD-10-CM | POA: Insufficient documentation

## 2019-04-13 DIAGNOSIS — R079 Chest pain, unspecified: Secondary | ICD-10-CM | POA: Diagnosis present

## 2019-04-13 DIAGNOSIS — N189 Chronic kidney disease, unspecified: Secondary | ICD-10-CM | POA: Insufficient documentation

## 2019-04-13 DIAGNOSIS — R109 Unspecified abdominal pain: Secondary | ICD-10-CM | POA: Insufficient documentation

## 2019-04-13 DIAGNOSIS — E876 Hypokalemia: Secondary | ICD-10-CM | POA: Insufficient documentation

## 2019-04-13 DIAGNOSIS — F419 Anxiety disorder, unspecified: Secondary | ICD-10-CM | POA: Insufficient documentation

## 2019-04-13 HISTORY — DX: Acute myocardial infarction, unspecified: I21.9

## 2019-04-13 LAB — TROPONIN I (HIGH SENSITIVITY)
Troponin I (High Sensitivity): 26 ng/L — ABNORMAL HIGH (ref <18)
Troponin I (High Sensitivity): 24 ng/L — ABNORMAL HIGH (ref <18)
Troponin I (High Sensitivity): 19 ng/L — ABNORMAL HIGH (ref <18)

## 2019-04-13 LAB — CBC WITH DIFFERENTIAL/PLATELET
Abs Immature Granulocytes: 0.05 10*3/uL (ref 0.00–0.07)
Basophils Absolute: 0.1 10*3/uL (ref 0.0–0.1)
Basophils Relative: 0 %
Eosinophils Absolute: 0.2 10*3/uL (ref 0.0–0.5)
Eosinophils Relative: 1 %
HCT: 31.6 % — ABNORMAL LOW (ref 36.0–46.0)
Hemoglobin: 10.8 g/dL — ABNORMAL LOW (ref 12.0–15.0)
Immature Granulocytes: 0 %
Lymphocytes Relative: 22 %
Lymphs Abs: 3.4 10*3/uL (ref 0.7–4.0)
MCH: 32.3 pg (ref 26.0–34.0)
MCHC: 34.2 g/dL (ref 30.0–36.0)
MCV: 94.6 fL (ref 80.0–100.0)
Monocytes Absolute: 1.2 10*3/uL — ABNORMAL HIGH (ref 0.1–1.0)
Monocytes Relative: 8 %
Neutro Abs: 10.7 10*3/uL — ABNORMAL HIGH (ref 1.7–7.7)
Neutrophils Relative %: 69 %
Platelets: 360 10*3/uL (ref 150–400)
RBC: 3.34 MIL/uL — ABNORMAL LOW (ref 3.87–5.11)
RDW: 11.6 % (ref 11.5–15.5)
WBC: 15.5 10*3/uL — ABNORMAL HIGH (ref 4.0–10.5)
nRBC: 0 % (ref 0.0–0.2)

## 2019-04-13 LAB — APTT: aPTT: 68 seconds — ABNORMAL HIGH (ref 24–36)

## 2019-04-13 LAB — BASIC METABOLIC PANEL
Anion gap: 8 (ref 5–15)
BUN: 15 mg/dL (ref 6–20)
CO2: 24 mmol/L (ref 22–32)
Calcium: 8.3 mg/dL — ABNORMAL LOW (ref 8.9–10.3)
Chloride: 103 mmol/L (ref 98–111)
Creatinine, Ser: 1.04 mg/dL — ABNORMAL HIGH (ref 0.44–1.00)
GFR calc Af Amer: >60 mL/min (ref >60)
GFR calc non Af Amer: >60 mL/min (ref >60)
Glucose, Bld: 136 mg/dL — ABNORMAL HIGH (ref 70–99)
Potassium: 4 mmol/L (ref 3.5–5.1)
Sodium: 135 mmol/L (ref 135–145)

## 2019-04-13 LAB — BRAIN NATRIURETIC PEPTIDE: B Natriuretic Peptide: 111 pg/mL — ABNORMAL HIGH (ref 0.0–100.0)

## 2019-04-13 LAB — RESPIRATORY PANEL BY RT PCR (FLU A&B, COVID)
Influenza A by PCR: NEGATIVE
Influenza B by PCR: NEGATIVE
SARS Coronavirus 2 by RT PCR: NEGATIVE

## 2019-04-13 LAB — PROTIME-INR
INR: 1 (ref 0.8–1.2)
Prothrombin Time: 12.6 seconds (ref 11.4–15.2)

## 2019-04-13 LAB — SEDIMENTATION RATE: Sed Rate: 70 mm/hr — ABNORMAL HIGH (ref 0–20)

## 2019-04-13 LAB — PROCALCITONIN: Procalcitonin: <0.1 ng/mL

## 2019-04-13 LAB — FIBRIN DERIVATIVES D-DIMER (ARMC ONLY): Fibrin derivatives D-dimer (ARMC): 1581.99 ng/mL (FEU) — ABNORMAL HIGH (ref 0.00–499.00)

## 2019-04-13 LAB — HEPARIN LEVEL (UNFRACTIONATED): Heparin Unfractionated: 0.68 IU/mL (ref 0.30–0.70)

## 2019-04-13 LAB — HCG, QUANTITATIVE, PREGNANCY: hCG, Beta Chain, Quant, S: <1 m[IU]/mL (ref <5)

## 2019-04-13 MED ORDER — IOHEXOL 350 MG/ML SOLN
75.0000 mL | Freq: Once | INTRAVENOUS | Status: AC | PRN
  Administered 2019-04-13: 21:00:00 75 mL via INTRAVENOUS

## 2019-04-13 MED ORDER — CLOPIDOGREL BISULFATE 75 MG PO TABS
75.0000 mg | ORAL_TABLET | Freq: Every day | ORAL | Status: DC
  Administered 2019-04-13 – 2019-04-14 (×2): 75 mg via ORAL
  Filled 2019-04-13 (×2): qty 1

## 2019-04-13 MED ORDER — HEPARIN BOLUS VIA INFUSION
3500.0000 [IU] | Freq: Once | INTRAVENOUS | Status: AC
  Administered 2019-04-13: 05:00:00 3500 [IU] via INTRAVENOUS
  Filled 2019-04-13: qty 3500

## 2019-04-13 MED ORDER — ONDANSETRON HCL 4 MG/2ML IJ SOLN: 4.0000 mg | Freq: Four times a day (QID) | INTRAMUSCULAR | Status: DC | PRN

## 2019-04-13 MED ORDER — HEPARIN (PORCINE) 25000 UT/250ML-% IV SOLN
800.0000 [IU]/h | INTRAVENOUS | Status: DC
  Administered 2019-04-13: 800 [IU]/h via INTRAVENOUS
  Filled 2019-04-13: qty 250

## 2019-04-13 MED ORDER — SODIUM CHLORIDE 0.9 % IV BOLUS
1000.0000 mL | Freq: Once | INTRAVENOUS | Status: AC
  Administered 2019-04-13: 04:00:00 1000 mL via INTRAVENOUS

## 2019-04-13 MED ORDER — COLCHICINE 0.6 MG PO TABS
0.6000 mg | ORAL_TABLET | Freq: Two times a day (BID) | ORAL | Status: DC
  Administered 2019-04-13 – 2019-04-14 (×3): 0.6 mg via ORAL
  Filled 2019-04-13 (×3): qty 1

## 2019-04-13 MED ORDER — CARVEDILOL 3.125 MG PO TABS
3.1250 mg | ORAL_TABLET | Freq: Two times a day (BID) | ORAL | Status: DC
  Administered 2019-04-13 – 2019-04-14 (×3): 3.125 mg via ORAL
  Filled 2019-04-13 (×3): qty 1

## 2019-04-13 MED ORDER — ACETAMINOPHEN 325 MG PO TABS
650.0000 mg | ORAL_TABLET | ORAL | Status: DC | PRN
  Administered 2019-04-14: 06:00:00 650 mg via ORAL
  Filled 2019-04-13: qty 2

## 2019-04-13 MED ORDER — IBUPROFEN 400 MG PO TABS
600.0000 mg | ORAL_TABLET | Freq: Three times a day (TID) | ORAL | Status: DC
  Administered 2019-04-13 – 2019-04-14 (×3): 600 mg via ORAL
  Filled 2019-04-13 (×4): qty 2

## 2019-04-13 MED ORDER — ONDANSETRON HCL 4 MG/2ML IJ SOLN
4.0000 mg | Freq: Once | INTRAMUSCULAR | Status: AC
  Administered 2019-04-13: 4 mg via INTRAVENOUS
  Filled 2019-04-13: qty 2

## 2019-04-13 MED ORDER — ATORVASTATIN CALCIUM 20 MG PO TABS
40.0000 mg | ORAL_TABLET | Freq: Every day | ORAL | Status: DC
  Administered 2019-04-13: 17:00:00 40 mg via ORAL
  Filled 2019-04-13: qty 2

## 2019-04-13 MED ORDER — ASPIRIN EC 81 MG PO TBEC
81.0000 mg | DELAYED_RELEASE_TABLET | Freq: Every day | ORAL | Status: DC
  Administered 2019-04-14: 10:00:00 81 mg via ORAL
  Filled 2019-04-13: qty 1

## 2019-04-13 MED ORDER — FENTANYL CITRATE (PF) 100 MCG/2ML IJ SOLN
50.0000 ug | Freq: Once | INTRAMUSCULAR | Status: AC
  Administered 2019-04-13: 50 ug via INTRAVENOUS
  Filled 2019-04-13: qty 2

## 2019-04-13 NOTE — ED Provider Notes (Signed)
[facility] Emergency Department Provider Note  ____________________________________________  Time seen: Approximately 5:02 AM  I have reviewed the triage vital signs and the nursing notes.   HISTORY  Chief Complaint Chest Pain   HPI Molly Dickerson is a 39 y.o. female with a history of CAD status post STEMI 2 weeks ago on Plavix, opioid dependence on Suboxone, provoked DVT in 2010, chronic kidney disease, smoking who presents for evaluation of chest pain.  Patient reports that she has had chest pain for 24 hours constantly sharp located in the center of her chest radiating down her left arm.  The pain is worse with deep inspiration.  Pain is similar to her prior heart attack.  This evening the pain became severe and woke her up from her sleep.  She received 1 sublingual nitro per EMS.  She had taken 5 x 81 mg aspirin prior to their arrival.  Patient's blood pressure significantly dropping around after the nitro.  Patient then became clammy, dizzy, complaining of blurry vision and feeling like she was going to pass out.   Past Medical History:  Diagnosis Date  . Anxiety   . Chronic kidney disease   . Depression   . Drug addiction [facility])    h/o addiction to pain pills  . DVT (deep venous thrombosis) (HCC) 2010   post partum  . Kidney stones   . MI (myocardial infarction) [facility])     Patient Active Problem List   Diagnosis Date Noted  . Unstable angina (HCC) 04/13/2019  . CAD in native artery 04/04/2019  . Substance abuse (HCC) 04/04/2019  . Opioid dependence (HCC) 04/04/2019  . Anemia 04/04/2019  . Hypokalemia 04/04/2019  . Leukocytosis 04/04/2019  . Deep vein thrombosis (DVT), postpartum 04/04/2019  . Tobacco use 04/04/2019  . STEMI involving left anterior descending coronary artery (HCC) 04/01/2019    Past Surgical History:  Procedure Laterality Date  . KIDNEY STONE SURGERY    . LAPAROSCOPIC TUBAL LIGATION  09/26/2010   Procedure: LAPAROSCOPIC  TUBAL LIGATION;  Surgeon: Tereso Newcomer, MD;  Location: [facility] ORS;  Service: Gynecology;  Laterality: Bilateral;  . LEFT HEART CATH AND CORONARY ANGIOGRAPHY N/A 04/01/2019   Procedure: LEFT HEART CATH AND CORONARY ANGIOGRAPHY;  Surgeon: Yvonne Kendall, MD;  Location: [facility] INVASIVE CV LAB;  Service: Cardiovascular;  Laterality: N/A;  . NASAL SINUS SURGERY      Prior to Admission medications   Medication Sig Start Date End Date Taking? Authorizing Provider  aspirin 81 MG chewable tablet Chew 1 tablet (81 mg total) by mouth daily. 04/05/19  Yes Dunn, Raymon Mutton, PA-C  atorvastatin (LIPITOR) 80 MG tablet Take 1 tablet (80 mg total) by mouth daily at 6 PM. 04/04/19  Yes Dunn, Raymon Mutton, PA-C  buprenorphine-naloxone (SUBOXONE) 8-2 mg SUBL SL tablet Place 1 tablet under the tongue 3 (three) times daily. 03/28/19  Yes [provider]  busPIRone (BUSPAR) 10 MG tablet Take 10 mg by mouth 2 (two) times daily. 11/01/18  Yes [provider]  clopidogrel (PLAVIX) 75 MG tablet Take 1 tablet (75 mg total) by mouth daily. 04/05/19  Yes Dunn, Raymon Mutton, PA-C  ketorolac (TORADOL) 10 MG tablet Take 1 tablet (10 mg total) by mouth every 8 (eight) hours as needed for moderate pain (chest pain). 04/04/19  Yes Dunn, Raymon Mutton, PA-C  metoprolol tartrate (LOPRESSOR) 25 MG tablet Take 0.5 tablets (12.5 mg total) by mouth 2 (two) times daily. 04/04/19  Yes Sondra Barges, PA-C  nicotine (NICODERM CQ - DOSED IN MG/24 HOURS) 14 mg/24hr patch Place 1 patch (14 mg total) onto the skin daily. 04/05/19  Yes Dunn, Raymon Mutton, PA-C  colchicine 0.6 MG tablet Take 1 tablet (0.6 mg total) by mouth 2 (two) times daily. Patient not taking: Reported on 04/13/2019 04/04/19   Sondra Barges, PA-C    Allergies Patient has no known allergies.  Family History  Problem Relation Age of Onset  . Heart disease Neg Hx     Social History Social History   Tobacco Use  . Smoking status: Current Every Day Smoker    Packs/day: 0.50    Types: Cigarettes   . Smokeless tobacco: Never Used  Substance Use Topics  . Alcohol use: Yes    Comment: few drinks a week.  . Drug use: Not Currently    Comment: history of opioid abuse; on suboxone x 8 hears    Review of Systems  Constitutional: Negative for fever. + Lightheadedness Eyes: Negative for visual changes. ENT: Negative for sore throat. Neck: No neck pain  Cardiovascular: + chest pain, diaphoretic Respiratory: Negative for shortness of breath. Gastrointestinal: Negative for abdominal pain, vomiting or diarrhea. Genitourinary: Negative for dysuria. Musculoskeletal: Negative for back pain. Skin: Negative for rash. Neurological: Negative for headaches, weakness or numbness. Psych: No SI or HI  ____________________________________________   PHYSICAL EXAM:  VITAL SIGNS: ED Triage Vitals  Enc Vitals Group     BP 04/13/19 0353 101/84     Pulse Rate 04/13/19 0353 (!) 59     Resp 04/13/19 0353 15     Temp 04/13/19 0353 97.9 F (36.6 C)     Temp Source 04/13/19 0353 Oral     SpO2 04/13/19 0353 99 %     Weight 04/13/19 0356 129 lb (58.5 kg)     Height 04/13/19 0356 5' (1.524 m)     Head Circumference --      Peak Flow --      Pain Score 04/13/19 0355 0     Pain Loc --      Pain Edu? --      Excl. in GC? --     Constitutional: Alert and oriented, pale and diaphoretic.  HEENT:      Head: Normocephalic and atraumatic.         Eyes: Conjunctivae are normal. Sclera is non-icteric.       Mouth/Throat: Mucous membranes are moist.       Neck: Supple with no signs of meningismus. Cardiovascular: Bradycardic with regular rhythm. No murmurs, gallops, or rubs. 2+ symmetrical distal pulses are present in all extremities. No JVD. Respiratory: Normal respiratory effort. Lungs are clear to auscultation bilaterally. No wheezes, crackles, or rhonchi.  Gastrointestinal: Soft, non tender, and non distended with positive bowel sounds. No rebound or guarding. Musculoskeletal: Nontender with  normal range of motion in all extremities. No edema, cyanosis, or erythema of extremities. Neurologic: Normal speech and language. Face is symmetric. Moving all extremities. No gross focal neurologic deficits are appreciated. Skin: Skin is warm, dry and intact. No rash noted. Psychiatric: Mood and affect are normal. Speech and behavior are normal.  ____________________________________________   LABS (all labs ordered are listed, but only abnormal results are displayed)  Labs Reviewed  CBC WITH DIFFERENTIAL/PLATELET - Abnormal; Notable for the following components:      Result Value   WBC 15.5 (*)    RBC 3.34 (*)    Hemoglobin 10.8 (*)    HCT 31.6 (*)  Neutro Abs 10.7 (*)    Monocytes Absolute 1.2 (*)    All other components within normal limits  BASIC METABOLIC PANEL - Abnormal; Notable for the following components:   Glucose, Bld 136 (*)    Creatinine, Ser 1.04 (*)    Calcium 8.3 (*)    All other components within normal limits  APTT - Abnormal; Notable for the following components:   aPTT 68 (*)    All other components within normal limits  TROPONIN I (HIGH SENSITIVITY) - Abnormal; Notable for the following components:   Troponin I (High Sensitivity) 26 (*)    All other components within normal limits  HCG, QUANTITATIVE, PREGNANCY  PROTIME-INR  HEPARIN LEVEL (UNFRACTIONATED)  TROPONIN I (HIGH SENSITIVITY)   ____________________________________________  EKG  ED ECG REPORT I, Rudene Re, the attending physician, personally viewed and interpreted this ECG.   3:51 AM: Normal sinus rhythm, rate of 68, normal intervals, normal axis, no ST elevations, new T wave inversions in the inferior lateral leads.  4:07 AM: Normal sinus rhythm, rate 70, normal intervals, normal axis, no ST elevations and persistent T wave inversions in inferior and lateral leads. ____________________________________________  RADIOLOGY  I have personally reviewed the images performed during  this visit and I agree with the Radiologist's read.   Interpretation by Radiologist:  DG Chest Portable 1 View  Result Date: 04/13/2019 CLINICAL DATA:  Chest pain EXAM: PORTABLE CHEST 1 VIEW COMPARISON:  04/01/2019 chest radiograph. FINDINGS: Stable cardiomediastinal silhouette with mild cardiomegaly. No pneumothorax. No pleural effusion. No overt pulmonary edema. Mild left basilar patchy opacity. IMPRESSION: 1. Stable mild cardiomegaly without overt pulmonary edema. 2. Mild patchy left basilar lung opacity, favor atelectasis. Electronically Signed   By: Ilona Sorrel M.D.   On: 04/13/2019 04:24     ____________________________________________   PROCEDURES  Procedure(s) performed: None Procedures Critical Care performed: yes  CRITICAL CARE Performed by: Rudene Re  ?  Total critical care time: 30 min  Critical care time was exclusive of separately billable procedures and treating other patients.  Critical care was necessary to treat or prevent imminent or life-threatening deterioration.  Critical care was time spent personally by me on the following activities: development of treatment plan with patient and/or surrogate as well as nursing, discussions with consultants, evaluation of patient's response to treatment, examination of patient, obtaining history from patient or surrogate, ordering and performing treatments and interventions, ordering and review of laboratory studies, ordering and review of radiographic studies, pulse oximetry and re-evaluation of patient's condition.  ____________________________________________   INITIAL IMPRESSION / ASSESSMENT AND PLAN / ED COURSE  39 y.o. female with a history of CAD status post STEMI 2 weeks ago on Plavix, opioid dependence on Suboxone, provoked DVT in 2010, chronic kidney disease, smoking who presents for evaluation of chest pain.  Patient had a STEMI on 04/01/2019 and underwent a left heart catheterization which showed a  distal LAD occlusion.  Cardiology was unable to place a stent and recommended medical therapy.  She is on Plavix and aspirin.  Her story sounds somewhat atypical for ACS however with recent STEMI, no stent placement, new EKG changes it is obviously my differential diagnosis.  With no tachycardia, tachypnea, hypoxia I think it is less likely that this is a pulmonary embolism although possible.  She has a history of having a DVT in the past although that was provoked in the setting of her pregnancy.  Also possible pericarditis or myocarditis with recent catheterization.  Chest x-ray showed no pneumonia  or pneumothorax.  Patient arrived pale and diaphoretic and hypotensive after receiving nitro per EMS.  After 2 L of normal saline pressure has now normalized.  Patient no longer pale or diaphoretic.  Continues to complain of chest pain.  Will give fentanyl.  We have done 2 EKGs so far and none of them shows any evidence of acute STEMI. 1st troponin 28. Will start patient on heparin and admit to Hospitalist.       As part of my medical decision making, I reviewed the following data within the electronic MEDICAL RECORD NUMBER Nursing notes reviewed and incorporated, Labs reviewed , EKG interpreted , Old EKG reviewed, Old chart reviewed, Radiograph reviewed , Discussed with admitting physician , Notes from prior ED visits and [place] Controlled Substance Database   Please note:  Patient was evaluated in Emergency Department today for the symptoms described in the history of present illness. Patient was evaluated in the context of the global COVID-19 pandemic, which necessitated consideration that the patient might be at risk for infection with the SARS-CoV-2 virus that causes COVID-19. Institutional protocols and algorithms that pertain to the evaluation of patients at risk for COVID-19 are in a state of rapid change based on information released by regulatory bodies including the CDC and federal and state organizations.  These policies and algorithms were followed during the patient's care in the ED.  Some ED evaluations and interventions may be delayed as a result of limited staffing during the pandemic.   ____________________________________________   FINAL CLINICAL IMPRESSION(S) / ED DIAGNOSES   Final diagnoses:  Chest pain, unspecified type      NEW MEDICATIONS STARTED DURING THIS VISIT:  ED Discharge Orders    None       Note:  This document was prepared using Dragon voice recognition software and may include unintentional dictation errors.    Don Perking, [place], MD 04/13/19 [phone]

## 2019-04-13 NOTE — H&P (Signed)
History and Physical    Molly Dickerson Molly Dickerson DOB: [birth year] DOA: 04/13/2019  PCP: Patient, No Pcp Per   Patient coming from: home  I have personally briefly reviewed patient's old medical records in [facility]  Chief Complaint: chest pain  HPI: Molly Dickerson is a 39 y.o. female with medical history significant for STEMI on 03/31/2018 with apical LAD occlusion too small for percutaneous intervention, treated medically, discharged on dual antiplatelet therapy, who also has history of narcotic abuse on Suboxone, nicotine dependence and history of postpartum DVT, who presents to the emergency room with chest pain, similar to the pain she had during her recent STEMI.  She states that her chest pain was slow to go away and she was here the last time but following discharge had a few pain-free days but the pain started up 2 days ago, and suddenly became worse during the night.  Describes the pain as tightness in the retrosternal area, traveling to the left ear, shoulder and arm.  Intensity is 8 out of 10.  Associated with nausea but no vomiting.  The pain is worse with lying flat and deep inspiration.  She has associated shortness of breath elated to the pain.  During her recent hospitalization initial troponin was 6484, trending down to 1166 by discharge.  EMS administered sublingual nitroglycerin in route ED Course: Upon arrival in the emergency room she was awake and alert and oriented.  Blood pressure was initially 101/65 falling to 61/39, likely from administration of nitroglycerin but improving to 105/68 with IV fluid bolus.  Other vitals were unremarkable and O2 sat was 100% on room air.  First troponin was 26.  EKG showed T wave inversions in D2-D3 aVF as well as V4 V5 V6.  Other blood work significant for mild anemia of 10.8, elevated white cell count of 15.5 and slightly elevated creatinine of 1.04.  Chest x-ray showed mild cardiomegaly without pulmonary edema.  She achieved pain  relief with fentanyl.  Was started on a heparin infusion.  Hospitalist is consulted for admission.  Review of Systems: As per HPI otherwise 10 point review of systems negative.   Past Medical History:  Diagnosis Date  . Anxiety   . Chronic kidney disease   . Depression   . Drug addiction [facility])    h/o addiction to pain pills  . DVT (deep venous thrombosis) (HCC) 2010   post partum  . Kidney stones   . MI (myocardial infarction) [facility])     Past Surgical History:  Procedure Laterality Date  . KIDNEY STONE SURGERY    . LAPAROSCOPIC TUBAL LIGATION  09/26/2010   Procedure: LAPAROSCOPIC TUBAL LIGATION;  Surgeon: Tereso Newcomer, MD;  Location: [facility] ORS;  Service: Gynecology;  Laterality: Bilateral;  . LEFT HEART CATH AND CORONARY ANGIOGRAPHY N/A 04/01/2019   Procedure: LEFT HEART CATH AND CORONARY ANGIOGRAPHY;  Surgeon: Yvonne Kendall, MD;  Location: [facility] INVASIVE CV LAB;  Service: Cardiovascular;  Laterality: N/A;  . NASAL SINUS SURGERY       reports that she has been smoking cigarettes. She has been smoking about 0.50 packs per day. She has never used smokeless tobacco. She reports current alcohol use. She reports previous drug use.  No Known Allergies  Family History  Problem Relation Age of Onset  . Heart disease Neg Hx      Prior to Admission medications   Medication Sig Start Date End Date Taking? Authorizing Provider  aspirin 81 MG chewable tablet Chew 1 tablet (  81 mg total) by mouth daily. 04/05/19  Yes Dunn, Areta Haber, PA-C  atorvastatin (LIPITOR) 80 MG tablet Take 1 tablet (80 mg total) by mouth daily at 6 PM. 04/04/19  Yes Dunn, Areta Haber, PA-C  buprenorphine-naloxone (SUBOXONE) 8-2 mg SUBL SL tablet Place 1 tablet under the tongue 3 (three) times daily. 03/28/19  Yes [provider]  busPIRone (BUSPAR) 10 MG tablet Take 10 mg by mouth 2 (two) times daily. 11/01/18  Yes [provider]  clopidogrel (PLAVIX) 75 MG tablet Take 1 tablet (75 mg total) by mouth daily.  04/05/19  Yes Dunn, Areta Haber, PA-C  ketorolac (TORADOL) 10 MG tablet Take 1 tablet (10 mg total) by mouth every 8 (eight) hours as needed for moderate pain (chest pain). 04/04/19  Yes Dunn, Areta Haber, PA-C  metoprolol tartrate (LOPRESSOR) 25 MG tablet Take 0.5 tablets (12.5 mg total) by mouth 2 (two) times daily. 04/04/19  Yes Dunn, Areta Haber, PA-C  nicotine (NICODERM CQ - DOSED IN MG/24 HOURS) 14 mg/24hr patch Place 1 patch (14 mg total) onto the skin daily. 04/05/19  Yes Dunn, Areta Haber, PA-C  colchicine 0.6 MG tablet Take 1 tablet (0.6 mg total) by mouth 2 (two) times daily. Patient not taking: Reported on 04/13/2019 04/04/19   Rise Mu, PA-C    Physical Exam: Vitals:   04/13/19 0356 04/13/19 0415 04/13/19 0430 04/13/19 0500  BP:  94/60 104/72 105/68  Pulse:  68 68 81  Resp:  20 (!) 21 16  Temp:      TempSrc:      SpO2:  99% 100% 100%  Weight: 58.5 kg     Height: 5' (1.524 m)        Vitals:   04/13/19 0356 04/13/19 0415 04/13/19 0430 04/13/19 0500  BP:  94/60 104/72 105/68  Pulse:  68 68 81  Resp:  20 (!) 21 16  Temp:      TempSrc:      SpO2:  99% 100% 100%  Weight: 58.5 kg     Height: 5' (1.524 m)       Constitutional: NAD, alert and oriented x 3, in mild pain discomfort Eyes: PERRL, lids and conjunctivae normal ENMT: Mucous membranes are moist.  Neck: normal, supple, no masses, no thyromegaly Respiratory: clear to auscultation bilaterally, no wheezing, no crackles. Shallow breathing with mild tachypnea Cardiovascular: Regular rate and rhythm, no murmurs / rubs / gallops. No extremity edema. 2+ pedal pulses. No carotid bruits.  Abdomen: no tenderness, no masses palpated. No hepatosplenomegaly. Bowel sounds positive.  Musculoskeletal: no clubbing / cyanosis. No joint deformity upper and lower extremities.  Skin: no rashes, lesions, ulcers.  Neurologic: No gross focal neurologic deficit. Psychiatric: Normal mood and affect.   Labs on Admission: I have personally reviewed following  labs and imaging studies  CBC: Recent Labs  Lab 04/13/19 0403  WBC 15.5*  NEUTROABS 10.7*  HGB 10.8*  HCT 31.6*  MCV 94.6  PLT 914   Basic Metabolic Panel: Recent Labs  Lab 04/13/19 0403  NA 135  K 4.0  CL 103  CO2 24  GLUCOSE 136*  BUN 15  CREATININE 1.04*  CALCIUM 8.3*   GFR: Estimated Creatinine Clearance: 58.7 mL/min (A) (by C-G formula based on SCr of 1.04 mg/dL (H)). Liver Function Tests: No results for input(s): AST, ALT, ALKPHOS, BILITOT, PROT, ALBUMIN in the last 168 hours. No results for input(s): LIPASE, AMYLASE in the last 168 hours. No results for input(s): AMMONIA in the last  168 hours. Coagulation Profile: Recent Labs  Lab 04/13/19 0403  INR 1.0   Cardiac Enzymes: No results for input(s): CKTOTAL, CKMB, CKMBINDEX, TROPONINI in the last 168 hours. BNP (last 3 results) No results for input(s): PROBNP in the last 8760 hours. HbA1C: No results for input(s): HGBA1C in the last 72 hours. CBG: No results for input(s): GLUCAP in the last 168 hours. Lipid Profile: No results for input(s): CHOL, HDL, LDLCALC, TRIG, CHOLHDL, LDLDIRECT in the last 72 hours. Thyroid Function Tests: No results for input(s): TSH, T4TOTAL, FREET4, T3FREE, THYROIDAB in the last 72 hours. Anemia Panel: No results for input(s): VITAMINB12, FOLATE, FERRITIN, TIBC, IRON, RETICCTPCT in the last 72 hours. Urine analysis:    Component Value Date/Time   COLORURINE Yellow 12/27/2011 1538   COLORURINE YELLOW 05/25/2010 1001   APPEARANCEUR Cloudy 12/27/2011 1538   LABSPEC 1.026 12/27/2011 1538   PHURINE 6.0 12/27/2011 1538   PHURINE 6.0 05/25/2010 1001   GLUCOSEU Negative 12/27/2011 1538   HGBUR Negative 12/27/2011 1538   HGBUR LARGE (A) 05/25/2010 1001   BILIRUBINUR Negative 12/27/2011 1538   KETONESUR Negative 12/27/2011 1538   KETONESUR NEGATIVE 05/25/2010 1001   PROTEINUR 100 mg/dL 97/35/3299 2426   PROTEINUR NEGATIVE 05/25/2010 1001   UROBILINOGEN 0.2 05/25/2010 1001    NITRITE Negative 12/27/2011 1538   NITRITE NEGATIVE 05/25/2010 1001   LEUKOCYTESUR Trace 12/27/2011 1538    Radiological Exams on Admission: DG Chest Portable 1 View  Result Date: 04/13/2019 CLINICAL DATA:  Chest pain EXAM: PORTABLE CHEST 1 VIEW COMPARISON:  04/01/2019 chest radiograph. FINDINGS: Stable cardiomediastinal silhouette with mild cardiomegaly. No pneumothorax. No pleural effusion. No overt pulmonary edema. Mild left basilar patchy opacity. IMPRESSION: 1. Stable mild cardiomegaly without overt pulmonary edema. 2. Mild patchy left basilar lung opacity, favor atelectasis. Electronically Signed   By: Delbert Phenix M.D.   On: 04/13/2019 04:24    EKG: Independently reviewed. T wave inversions as mentioned in HPI  Assessment/Plan   Unstable angina (HCC)   CAD in native artery with history of STEMI in apical LAD on 04/01/19 -Patient has a history of STEMI with apical LAD occlusion too small for PCI.  Received heparin infusion and discharged on dual antiplatelet therapy, high dose statin and beta-blocker -Presents with similar pain from recent STEMI -Initial troponin 26 and EKG showing T wave inversions in inferior and lateral leads -Continue heparin infusion started in the emergency room, resume antiplatelet therapy and beta-blocker -Hold nitroglycerin due to hypotensive reaction.  Morphine as needed for pain -Cardiology consult for further recommendations -sed rate and D-dimer will be added on evaluate for additional/coexisting etiology of chest pain  Leukocytosis -Possibly reactive from acute condition above.  No definite source of infection patient afebrile -Follow-up urinalysis, procalcitonin, -Chest x-ray did show "Mild patchy left basilar lung opacity, favor atelectasis" patient denied fever, cough  Hypotension following nitroglycerin --Pressure fell from 101/60 5-60 7/39 following sublingual nitroglycerin -Blood pressure responded well to IV fluid bolus -We will avoid  nitroglycerin for now.  Continue to monitor blood pressure  Anemia -hemoGlobin 10.8, stable from 11.2 from recent hospitalization -Continue to monitor in view of heparin therapy    Substance abuse (HCC)   Opioid dependence (HCC) -Follow-up urine drug screen     DVT prophylaxis: on heparin infusion Code Status: full code  Family Communication: none  Disposition Plan: Back to previous home environment Consults called: cardiology , Dr Elease Hashimoto    Andris Baumann MD [facility]     04/13/2019, 5:37 AM

## 2019-04-13 NOTE — ED Notes (Signed)
Pt reports chest pain for 2 days constant

## 2019-04-13 NOTE — Progress Notes (Addendum)
PROGRESS NOTE  MELEANE Dickerson IWP:[id number] DOB: 04/23/1980 DOA: 04/13/2019 PCP: Patient, No Pcp Per  Brief History   39 year old woman PMH STEMI 04/01/2019 with recommendation for medical management as single-vessel disease was too small for intervention, discharged on dual antiplatelet therapy, presented with chest pain similar to her previous STEMI.  Admitted for unstable angina.  A & P  ACS in setting of known CAD with recent STEMI.   --Hemodynamics are stable but she reports ongoing pain.  Troponin 26, EKG with T wave inversions inferolaterally --Continue heparin infusion, dual antiplatelet therapy, beta-blocker, statin --Avoid nitroglycerin secondary to hypotensive reaction --Follow-up cardiology consultation which is pending  Coronary artery disease, status post STEMI 04/01/2019, left heart cath showed single-vessel disease and medical management was recommended with aspirin, Plavix and statin. --Continue aspirin, Plavix, statin  Normocytic anemia --Stable.  Follow-up as an outpatient.  Substance abuse.  On Suboxone as an outpatient.  Last admission drug screen was positive for opioids, benzodiazepines and amphetamines.  Patient reportedly on Adderall previously.  During last hospitalization for follow-up urine drug screen in the hospital returned positive for cannabinoids. --Denies drug use.  Continue Suboxone.  PMH DVT 2010 when pregnant.  No other DVTs. --Exam is benign, no lower extremity edema or swelling.  ADDENDUM I discussed in detail with Dr. Acie Fredrickson, and I concur with his assessment at this time.  After reexamining the patient, there is this a friction rub left upper sternal border, fairly faint.  Proceed with treatment as outlined by cardiology.  If symptoms fail to improve for which condition changes we will check a CTA to rule out dissection but I think that less likely than Dressler syndrome.  Chart review --Discharged 2/12 after being admitted for NSTEMI.  Underwent  left heart catheterization 2/9 which showed single-vessel coronary artery disease with occlusion of the apical LAD.  Medical therapy was recommended as lesion was felt to small and distal for percutaneous intervention.  Discharged on aspirin and clopidogrel as well as high intensity statin.  Also treated for possible pericarditis with colchicine.  Echocardiogram showed grade 2 diastolic dysfunction, LVEF 60-65%.  Disposition Plan:  From: home Anticipated disposition: home Discussion: History somewhat concerning for ACS.  Continue heparin infusion and treatment as above.  Await cardiology recommendations.  Her pain is somewhat atypical in that she has some musculoskeletal tenderness.  Pleurisy raises the question of post MI syndrome.  Of note she was treated for possible pericarditis on last admission.  Will need further monitoring investigations none stable for discharge as she cannot to have pain  DVT prophylaxis: heparin infusion Code Status: full Family Communication: none present    Molly Hodgkins, MD  [facility] Direct contact: see [email or web address] (further directions at bottom of note if needed) 7PM-7AM contact night coverage as at bottom of note 04/13/2019, 9:52 AM  LOS: 0 days   Significant Hospital Events   . 2/21 admitted for ACS.   Consults:  . Cardiology   Procedures:  .   Significant Diagnostic Tests:   SARS-CoV-2 negative  Urine pregnancy negative  Urine drug screen positive for negative for opiates  Chest x-ray no acute disease  EKG sinus rhythm, inferolateral T wave inversion, no STEMI   Micro Data:  .    Antimicrobials:  .   Interval History/Subjective  Reports ongoing pain in her left neck, radiating down into the shoulder and arm.  Pleurisy.  Has been going on for approximately 2 days.  Similar to previous STEMI.  Denies injury.  Denies  drug use.  Reports history of DVTs during pregnancy, otherwise none.  Objective   Vitals:  Vitals:   04/13/19  0715 04/13/19 0805  BP: (!) 102/56 99/62  Pulse: 68 63  Resp: 14 20  Temp:  98.1 F (36.7 C)  SpO2: 98% 99%    Exam:  Constitutional.  Was sleeping when I arrived.  When I woke her up she does appear uncomfortable.  Nontoxic. Psychiatric.  Alert, oriented.  Grossly normal mood and affect.  Speech fluent and appropriate. Respiratory.  Clear to auscultation bilaterally.  No wheezes, rales or rhonchi.  Normal respiratory effort. Cardiovascular.  Regular rate and rhythm.  No murmur, rub or gallop.  No lower extremity edema.  Dorsalis pedis pulses 2+ bilaterally, symmetric.  Radial pulses 2+ bilaterally, symmetric. Skin of the neck on the left side appears unremarkable. Musculoskeletal.  She does have some tenderness with palpation along the neck musculature down into the shoulder and superior to the scapula.  I have personally reviewed the following:   Today's Data  . Basic metabolic panel unremarkable . Troponins flat 26, 24 . BNP insignificant elevation of 111 . Modest leukocytosis of 15.5.  Hemoglobin appears stable.  Scheduled Meds: . [START ON 04/14/2019] aspirin EC  81 mg Oral Daily  . atorvastatin  40 mg Oral q1800  . carvedilol  3.125 mg Oral BID WC  . clopidogrel  75 mg Oral Q breakfast   Continuous Infusions: . heparin 800 Units/hr (04/13/19 0522)    Principal Problem:   Unstable angina (HCC) Active Problems:   CAD in native artery   Substance abuse (HCC)   Opioid dependence (HCC)   Leukocytosis   History of ST elevation myocardial infarction (STEMI)   Hypotension due to medication - nitroglycerin SL   Normocytic anemia   LOS: 0 days   How to contact the [facility] Attending or Consulting provider 7A - 7P or covering provider during after hours 7P -7A, for this patient?  1. Check the care team in [facility] and look for a) attending/consulting TR[facility] provider listed and b) the [facility] team listed 2. Log into [email or web address] and use [place]'s universal password to access. If you  do not have the password, please contact the hospital operator. 3. Locate the [facility] provider you are looking for under [facility] and page to a number that you can be directly reached. 4. If you still have difficulty reaching the provider, please page the [facility] (Director on Call) for the Hospitalists listed on amion for assistance.   Prolonged services, [phone].  Review of chart, discussion with patient, examination of patient, coordination with cardiology.

## 2019-04-13 NOTE — Progress Notes (Signed)
Notified by radiology Trey Paula) that patient is refusing to continue with CTA. On call made aware.

## 2019-04-13 NOTE — Plan of Care (Signed)
Plan for discharge

## 2019-04-13 NOTE — Consult Note (Signed)
Cardiology Consultation:   Patient ID: Molly Dickerson MRN: [id number]; DOB: 1981/01/22  Admit date: 04/13/2019 Date of Consult: 04/13/2019  Primary Care Provider: Patient, No Pcp Per Primary Cardiologist: Yvonne Kendall, MD  Primary Electrophysiologist:  None    Patient Profile:   Molly Dickerson is a 39 y.o. female with a hx of  Recent NSTEMI  who is being seen today for the evaluation of  Recurrent chest pain  at the request of  Dr. Irene Limbo.  History of Present Illness:   Molly Dickerson is a 39 year old female who was admitted 2 weeks ago with a non-ST segment elevation myocardial infarction.  Heart catheterization at that time showed what appeared to be an occlusion of the distal LAD  She started having episodes of chest pain about a week or so ago.  Pain had resolved initially. The pain is described as pleuritic.  She has significant pain when she twist her torso.  She also has significant pain lying down flat which is eased when she sits forward.  She finds that she has difficulty in taking a deep breath because of this discomfort.  The pain seems to radiate up into her jaw and through to her back.  Heart Pathway Score:     Past Medical History:  Diagnosis Date  . Anxiety   . Chronic kidney disease   . Depression   . Drug addiction [facility])    h/o addiction to pain pills  . DVT (deep venous thrombosis) (HCC) 2010   post partum  . Kidney stones   . MI (myocardial infarction) [facility])     Past Surgical History:  Procedure Laterality Date  . KIDNEY STONE SURGERY    . LAPAROSCOPIC TUBAL LIGATION  09/26/2010   Procedure: LAPAROSCOPIC TUBAL LIGATION;  Surgeon: Tereso Newcomer, MD;  Location: [facility] ORS;  Service: Gynecology;  Laterality: Bilateral;  . LEFT HEART CATH AND CORONARY ANGIOGRAPHY N/A 04/01/2019   Procedure: LEFT HEART CATH AND CORONARY ANGIOGRAPHY;  Surgeon: Yvonne Kendall, MD;  Location: [facility] INVASIVE CV LAB;  Service: Cardiovascular;  Laterality: N/A;  . NASAL SINUS  SURGERY       Home Medications:  Prior to Admission medications   Medication Sig Start Date End Date Taking? Authorizing Provider  aspirin 81 MG chewable tablet Chew 1 tablet (81 mg total) by mouth daily. 04/05/19  Yes Dunn, Raymon Mutton, PA-C  atorvastatin (LIPITOR) 80 MG tablet Take 1 tablet (80 mg total) by mouth daily at 6 PM. 04/04/19  Yes Dunn, Raymon Mutton, PA-C  buprenorphine-naloxone (SUBOXONE) 8-2 mg SUBL SL tablet Place 1 tablet under the tongue 3 (three) times daily. 03/28/19  Yes [provider]  busPIRone (BUSPAR) 10 MG tablet Take 10 mg by mouth 2 (two) times daily. 11/01/18  Yes [provider]  clopidogrel (PLAVIX) 75 MG tablet Take 1 tablet (75 mg total) by mouth daily. 04/05/19  Yes Dunn, Raymon Mutton, PA-C  ketorolac (TORADOL) 10 MG tablet Take 1 tablet (10 mg total) by mouth every 8 (eight) hours as needed for moderate pain (chest pain). 04/04/19  Yes Dunn, Raymon Mutton, PA-C  metoprolol tartrate (LOPRESSOR) 25 MG tablet Take 0.5 tablets (12.5 mg total) by mouth 2 (two) times daily. 04/04/19  Yes Dunn, Raymon Mutton, PA-C  nicotine (NICODERM CQ - DOSED IN MG/24 HOURS) 14 mg/24hr patch Place 1 patch (14 mg total) onto the skin daily. 04/05/19  Yes Dunn, Raymon Mutton, PA-C  colchicine 0.6 MG tablet Take 1 tablet (0.6 mg total) by mouth 2 (  two) times daily. Patient not taking: Reported on 04/13/2019 04/04/19   Sondra Barges, PA-C    Inpatient Medications: Scheduled Meds: . [START ON 04/14/2019] aspirin EC  81 mg Oral Daily  . atorvastatin  40 mg Oral q1800  . carvedilol  3.125 mg Oral BID WC  . clopidogrel  75 mg Oral Q breakfast   Continuous Infusions: . heparin 800 Units/hr (04/13/19 0522)   PRN Meds: acetaminophen, ondansetron (ZOFRAN) IV  Allergies:   No Known Allergies  Social History:   Social History   Socioeconomic History  . Marital status: Single    Spouse name: Not on file  . Number of children: Not on file  . Years of education: Not on file  . Highest education level: Not on  file  Occupational History  . Not on file  Tobacco Use  . Smoking status: Current Every Day Smoker    Packs/day: 0.50    Types: Cigarettes  . Smokeless tobacco: Never Used  Substance and Sexual Activity  . Alcohol use: Yes    Comment: few drinks a week.  . Drug use: Not Currently    Comment: history of opioid abuse; on suboxone x 8 hears  . Sexual activity: Not on file  Other Topics Concern  . Not on file  Social History Narrative  . Not on file   Social Determinants of Health   Financial Resource Strain:   . Difficulty of Paying Living Expenses: Not on file  Food Insecurity:   . Worried About Programme researcher, broadcasting/film/video in the Last Year: Not on file  . Ran Out of Food in the Last Year: Not on file  Transportation Needs:   . Lack of Transportation (Medical): Not on file  . Lack of Transportation (Non-Medical): Not on file  Physical Activity:   . Days of Exercise per Week: Not on file  . Minutes of Exercise per Session: Not on file  Stress:   . Feeling of Stress : Not on file  Social Connections:   . Frequency of Communication with Friends and Family: Not on file  . Frequency of Social Gatherings with Friends and Family: Not on file  . Attends Religious Services: Not on file  . Active Member of Clubs or Organizations: Not on file  . Attends Banker Meetings: Not on file  . Marital Status: Not on file  Intimate Partner Violence:   . Fear of Current or Ex-Partner: Not on file  . Emotionally Abused: Not on file  . Physically Abused: Not on file  . Sexually Abused: Not on file    Family History:    Family History  Problem Relation Age of Onset  . Heart disease Neg Hx      ROS:  Please see the history of present illness.   All other ROS reviewed and negative.     Physical Exam/Data:   Vitals:   04/13/19 0645 04/13/19 0700 04/13/19 0715 04/13/19 0805  BP: 104/66 98/64 (!) 102/56 99/62  Pulse: 70 75 68 63  Resp: 12 18 14 20   Temp:    98.1 F (36.7 C)    TempSrc:    Oral  SpO2: 100% 97% 98% 99%  Weight:      Height:        Intake/Output Summary (Last 24 hours) at 04/13/2019 1110 Last data filed at 04/13/2019 0506 Gross per 24 hour  Intake 2000 ml  Output --  Net 2000 ml   Last 3 Weights 04/13/2019 04/02/2019  04/01/2019  Weight (lbs) 129 lb 122 lb 4.8 oz 135 lb  Weight (kg) 58.514 kg 55.475 kg 61.236 kg     Body mass index is 25.19 kg/m.  General:  Young female,  Anxious  HEENT: normal Lymph: no adenopathy Neck: no JVD Endocrine:  No thryomegaly Vascular: No carotid bruits; FA pulses 2+ bilaterally without bruits  Cardiac:  RR,   2 component friction rub.  No significant murmur  Lungs:  clear to auscultation bilaterally, no wheezing, rhonchi or rales,  No friction rub  Abd: soft, nontender, no hepatomegaly  Ext: no edema Musculoskeletal:  No deformities, BUE and BLE strength normal and equal Skin: warm and dry  Neuro:  CNs 2-12 intact, no focal abnormalities noted Psych:  Normal affect   EKG:  The EKG was personally reviewed and demonstrates:  NSR with TWI in leads V4 - V 6.  The anteriolateral TWI is new since previous ECG and likely represents evolving ant. Lat MI   Telemetry:   NSR   Relevant CV Studies:   Laboratory Data:  High Sensitivity Troponin:   Recent Labs  Lab 04/03/19 1104 04/04/19 0551 04/04/19 0824 04/13/19 0403 04/13/19 0624  TROPONINIHS 1,834* 1,323* 1,166* 26* 24*     Chemistry Recent Labs  Lab 04/13/19 0403  NA 135  K 4.0  CL 103  CO2 24  GLUCOSE 136*  BUN 15  CREATININE 1.04*  CALCIUM 8.3*  GFRNONAA >60  GFRAA >60  ANIONGAP 8    No results for input(s): PROT, ALBUMIN, AST, ALT, ALKPHOS, BILITOT in the last 168 hours. Hematology Recent Labs  Lab 04/13/19 0403  WBC 15.5*  RBC 3.34*  HGB 10.8*  HCT 31.6*  MCV 94.6  MCH 32.3  MCHC 34.2  RDW 11.6  PLT 360   BNP Recent Labs  Lab 04/13/19 0403  BNP 111.0*    DDimer No results for input(s): DDIMER in the last 168  hours.   Radiology/Studies:  DG Chest Portable 1 View  Result Date: 04/13/2019 CLINICAL DATA:  Chest pain EXAM: PORTABLE CHEST 1 VIEW COMPARISON:  04/01/2019 chest radiograph. FINDINGS: Stable cardiomediastinal silhouette with mild cardiomegaly. No pneumothorax. No pleural effusion. No overt pulmonary edema. Mild left basilar patchy opacity. IMPRESSION: 1. Stable mild cardiomegaly without overt pulmonary edema. 2. Mild patchy left basilar lung opacity, favor atelectasis. Electronically Signed   By: Delbert Phenix M.D.   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  Her pain definitely worsens with a deep breath which causes her to not be able to take a deep breath.  It is worse with lying supine and better when she sits forward.  The pain seems to also radiate out to the side of her left chest and up into her neck.  She states that this pain is worse than her presenting pain last week.  She has a 2 component friction rub consistent with pericarditis on exam.   Chest x-ray is not suggestive of a large pericardial effusion.  She does have some atelectasis which is probably because of her splinting and not taking deep breaths over the past several days.   Her EKG shows an evolving anterior apical myocardial infarction.  Troponin levels are only minimally elevated and are flat and are not consistent with recurrent acute coronary syndrome.  I suspect that she has pericarditis related to her recent infarction.  We will discontinue heparin.  We will start her on colchicine 0.6 mg twice a day as well as ibuprofen 6 mg 3 times a day.  We will allow her to eat today.  I have ordered an echocardiogram.   I suspect the colchicine  will help with her pain.  If she continues to have chest pain /  back pain we may need to consider her for a CT angiogram to rule out  dissection of the aorta.     2.  Recent NSTEMI:   Due to distal LAD occlusion.  I have reviewed the angiograms.   It is difficult to see an acute cut off of the LAD.    For questions or updates, please contact [place] Please consult [email or web address] for contact info under     Signed, Mertie Moores, MD  04/13/2019 11:10 AM

## 2019-04-13 NOTE — Progress Notes (Signed)
ANTICOAGULATION CONSULT NOTE - Initial Consult  Pharmacy Consult for Heparin Indication: chest pain/ACS  No Known Allergies  Patient Measurements: Height: 5' (152.4 cm) Weight: 129 lb (58.5 kg) IBW/kg (Calculated) : 45.5 HEPARIN DW (KG): 57.4  Vital Signs: Temp: 97.9 F (36.6 C) (02/21 0353) Temp Source: Oral (02/21 0353) BP: 104/72 (02/21 0430) Pulse Rate: 68 (02/21 0430)  Labs: Recent Labs    04/13/19 0403  HGB 10.8*  HCT 31.6*  PLT 360  CREATININE 1.04*  TROPONINIHS 26*   Estimated Creatinine Clearance: 58.7 mL/min (A) (by C-G formula based on SCr of 1.04 mg/dL (H)).  Medical History: Past Medical History:  Diagnosis Date  . Anxiety   . Chronic kidney disease   . Depression   . Drug addiction [facility])    h/o addiction to pain pills  . DVT (deep venous thrombosis) (HCC) 2010   post partum  . Kidney stones   . MI (myocardial infarction) (HCC)    Medications:  (Not in a hospital admission)   Assessment: Heparin protocol started for ACS.  No anticoagulants per current PTA med list.  Baseline labs ordered.   Goal of Therapy:  Heparin level 0.3-0.7 units/ml Monitor platelets by anticoagulation protocol: Yes   Plan:  Heparin 3500 units bolus x 1 then infusion at 800 units/hr Check HL in 6 hours  Valrie Hart A 04/13/2019,5:04 AM

## 2019-04-13 NOTE — ED Triage Notes (Signed)
Pt presents to ER via EMS with complaints of chest pain, pt was seen in hospital last week for MI unable to get stent artery at the time of procedure, EMS administered .4mg  Nitro pt's BP drop 60/34. Pt is awake, alert and oriented upon arrival, pt reports shortness of breath and diaphoretic.

## 2019-04-13 NOTE — Progress Notes (Signed)
*  PRELIMINARY RESULTS* Echocardiogram 2D Echocardiogram has been performed. This was a very Limited 2D only echocardiogram due to patient's uncooperativeness.  Molly Dickerson 04/13/2019, 2:57 PM

## 2019-04-14 DIAGNOSIS — R079 Chest pain, unspecified: Secondary | ICD-10-CM | POA: Diagnosis present

## 2019-04-14 DIAGNOSIS — I241 Dressler's syndrome: Secondary | ICD-10-CM

## 2019-04-14 DIAGNOSIS — I200 Unstable angina: Secondary | ICD-10-CM

## 2019-04-14 LAB — CBC
HCT: 29.7 % — ABNORMAL LOW (ref 36.0–46.0)
Hemoglobin: 9.8 g/dL — ABNORMAL LOW (ref 12.0–15.0)
MCH: 31.9 pg (ref 26.0–34.0)
MCHC: 33 g/dL (ref 30.0–36.0)
MCV: 96.7 fL (ref 80.0–100.0)
Platelets: 256 10*3/uL (ref 150–400)
RBC: 3.07 MIL/uL — ABNORMAL LOW (ref 3.87–5.11)
RDW: 11.7 % (ref 11.5–15.5)
WBC: 9.3 10*3/uL (ref 4.0–10.5)
nRBC: 0 % (ref 0.0–0.2)

## 2019-04-14 LAB — BASIC METABOLIC PANEL
Anion gap: 9 (ref 5–15)
BUN: 12 mg/dL (ref 6–20)
CO2: 23 mmol/L (ref 22–32)
Calcium: 8.1 mg/dL — ABNORMAL LOW (ref 8.9–10.3)
Chloride: 108 mmol/L (ref 98–111)
Creatinine, Ser: 0.82 mg/dL (ref 0.44–1.00)
GFR calc Af Amer: >60 mL/min (ref >60)
GFR calc non Af Amer: >60 mL/min (ref >60)
Glucose, Bld: 131 mg/dL — ABNORMAL HIGH (ref 70–99)
Potassium: 3.7 mmol/L (ref 3.5–5.1)
Sodium: 140 mmol/L (ref 135–145)

## 2019-04-14 LAB — LIPID PANEL
Cholesterol: 96 mg/dL (ref 0–200)
HDL: 45 mg/dL (ref >40)
LDL Cholesterol: 40 mg/dL (ref 0–99)
Total CHOL/HDL Ratio: 2.1 RATIO
Triglycerides: 53 mg/dL (ref <150)
VLDL: 11 mg/dL (ref 0–40)

## 2019-04-14 LAB — ECHOCARDIOGRAM COMPLETE
Height: 60 in
Weight: 2064 oz

## 2019-04-14 MED ORDER — IBUPROFEN 600 MG PO TABS: 600.0000 mg | ORAL_TABLET | Freq: Three times a day (TID) | ORAL | 0 refills | Status: DC

## 2019-04-14 MED ORDER — PANTOPRAZOLE SODIUM 40 MG PO TBEC: 40.0000 mg | DELAYED_RELEASE_TABLET | Freq: Every day | ORAL | 1 refills | Status: AC

## 2019-04-14 MED ORDER — PANTOPRAZOLE SODIUM 40 MG PO TBEC
40.0000 mg | DELAYED_RELEASE_TABLET | Freq: Every day | ORAL | Status: DC
  Administered 2019-04-14: 40 mg via ORAL
  Filled 2019-04-14: qty 1

## 2019-04-14 MED ORDER — IBUPROFEN 400 MG PO TABS
600.0000 mg | ORAL_TABLET | Freq: Once | ORAL | Status: AC
  Administered 2019-04-14: 14:00:00 600 mg via ORAL

## 2019-04-14 MED ORDER — COLCHICINE 0.6 MG PO TABS: 0.6000 mg | ORAL_TABLET | Freq: Two times a day (BID) | ORAL | 0 refills | Status: AC

## 2019-04-14 MED ORDER — IBUPROFEN 600 MG PO TABS: 600.0000 mg | ORAL_TABLET | Freq: Three times a day (TID) | ORAL | 0 refills | Status: AC

## 2019-04-14 MED ORDER — MELATONIN 5 MG PO TABS
10.0000 mg | ORAL_TABLET | Freq: Every evening | ORAL | Status: DC | PRN
  Administered 2019-04-14: 01:00:00 10 mg via ORAL
  Filled 2019-04-14: qty 2

## 2019-04-14 NOTE — Progress Notes (Signed)
Discharge instructions explained to pt/ verbalized an understanding/ iv and tele removed/ med RX sent to med management.

## 2019-04-14 NOTE — Discharge Instructions (Signed)
Pt advised to go to med management clinic for additional paper work to be done for colchicine
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
## 2019-04-14 NOTE — Progress Notes (Signed)
Pt c/o sharp chest pain/no distress noted/ no changes on tele/ MD made aware/ orders for extra dose of IB profin/ will monitor.

## 2019-04-14 NOTE — Progress Notes (Signed)
Progress Note  Patient Name: Molly Dickerson Date of Encounter: 04/14/2019  Primary Cardiologist: Yvonne Kendall, MD   Subjective   She reports ongoing chest discomfort this morning, describing aching pain that is pleuritic and worse with deeper breathing.  She states that it feels better when she leans forward.  She also reports sharp chest pain that is brief and lasts only seconds.  She reports her worse pain as L shoulder pain, which is her main concern. She feels as if she cannot get a deep breath due to her pain. She reports abdominal pain and is tender to palpation this morning.  She also reports lower extremity pain and is tender during exam to assess edema. She reports intermittent ear pain.  We discussed her current treatment for pericarditis and all questions were voiced and answered with patient understanding.  Inpatient Medications    Scheduled Meds: . aspirin EC  81 mg Oral Daily  . atorvastatin  40 mg Oral q1800  . carvedilol  3.125 mg Oral BID WC  . clopidogrel  75 mg Oral Q breakfast  . colchicine  0.6 mg Oral BID  . ibuprofen  600 mg Oral TID   Continuous Infusions:  PRN Meds: acetaminophen, Melatonin, ondansetron (ZOFRAN) IV   Vital Signs    Vitals:   04/13/19 1151 04/13/19 2006 04/14/19 0512 04/14/19 0844  BP: 108/63 (!) 100/57 100/64 101/64  Pulse: 68 72 80 74  Resp:  18 16 19   Temp: 98.2 F (36.8 C) 98 F (36.7 C) 98.5 F (36.9 C)   TempSrc: Oral Oral Oral   SpO2: 98% 100% 98% 98%  Weight:      Height:        Intake/Output Summary (Last 24 hours) at 04/14/2019 1055 Last data filed at 04/14/2019 0920 Gross per 24 hour  Intake 600 ml  Output --  Net 600 ml   Last 3 Weights 04/13/2019 04/02/2019 04/01/2019  Weight (lbs) 129 lb 122 lb 4.8 oz 135 lb  Weight (kg) 58.514 kg 55.475 kg 61.236 kg      Telemetry    Normal sinus rhythm with rates 60-80s with brief run of tachycardia and rates into the 160s- Personally Reviewed  ECG    No new  tracings - Personally Reviewed  Physical Exam   GEN: No acute distress.  Propped up in bed watching cartoons. Neck: No JVD Cardiac: RRR, no murmurs, rubs, or gallops. Slight pericardial friction rub heard on exam Respiratory: Clear to auscultation bilaterally. Poor inspiratory effort. GI: Soft, nontender, non-distended. Diffusely TTP. MS: No edema; No deformity. Diffusely TTP. Neuro:  Nonfocal  Psych: Normal affect, at times uncomfortable.  Labs    High Sensitivity Troponin:   Recent Labs  Lab 04/04/19 0551 04/04/19 0824 04/13/19 0403 04/13/19 0624 04/13/19 1056  TROPONINIHS 1,323* 1,166* 26* 24* 19*      Cardiac EnzymesNo results for input(s): TROPONINI in the last 168 hours. No results for input(s): TROPIPOC in the last 168 hours.   Chemistry Recent Labs  Lab 04/13/19 0403 04/14/19 0501  NA 135 140  K 4.0 3.7  CL 103 108  CO2 24 23  GLUCOSE 136* 131*  BUN 15 12  CREATININE 1.04* 0.82  CALCIUM 8.3* 8.1*  GFRNONAA >60 >60  GFRAA >60 >60  ANIONGAP 8 9     Hematology Recent Labs  Lab 04/13/19 0403 04/14/19 0501  WBC 15.5* 9.3  RBC 3.34* 3.07*  HGB 10.8* 9.8*  HCT 31.6* 29.7*  MCV 94.6 96.7  MCH 32.3 31.9  MCHC 34.2 33.0  RDW 11.6 11.7  PLT 360 256    BNP Recent Labs  Lab 04/13/19 0403  BNP 111.0*     DDimer No results for input(s): DDIMER in the last 168 hours.   Radiology    DG Chest Portable 1 View  Result Date: 04/13/2019 CLINICAL DATA:  Chest pain EXAM: PORTABLE CHEST 1 VIEW COMPARISON:  04/01/2019 chest radiograph. FINDINGS: Stable cardiomediastinal silhouette with mild cardiomegaly. No pneumothorax. No pleural effusion. No overt pulmonary edema. Mild left basilar patchy opacity. IMPRESSION: 1. Stable mild cardiomegaly without overt pulmonary edema. 2. Mild patchy left basilar lung opacity, favor atelectasis. Electronically Signed   By: Delbert Phenix M.D.   On: 04/13/2019 04:24   ECHOCARDIOGRAM COMPLETE  Result Date: 04/14/2019     ECHOCARDIOGRAM REPORT   Patient Name:   Molly Dickerson Date of Exam: 04/13/2019 Medical Rec #:  [id number]        Height:       60.0 in Accession #:    [id number]       Weight:       129.0 lb Date of Birth:  Oct 28, 1980       BSA:          1.549 m Patient Age:    38 years         BP:           108/63 mmHg Patient Gender: F                HR:           76 bpm. Exam Location:  [facility] Procedure: 2D Echo Indications:     CAD Native Vessel 414.01/ I25.10  History:         Patient has prior history of Echocardiogram examinations, most                  recent 04/02/2019.  Sonographer:     Wonda Cerise RDCS Referring Phys:  [id number] Deloris Ping NAHSER Diagnosing Phys: Lorine Bears MD  Sonographer Comments: Image acquisition challenging due to uncooperative patient. IMPRESSIONS  1. Left ventricular ejection fraction, by estimation, is 55 to 60%. The left ventricle has normal function. The left ventricle has no regional wall motion abnormalities. There is apical akinesis with no evidence of apical thrombus.  2. Right ventricular systolic function is normal. The right ventricular size is normal. Tricuspid regurgitation signal is inadequate for assessing PA pressure.  3. The mitral valve is normal in structure and function. Trivial mitral valve regurgitation. No evidence of mitral stenosis.  4. The aortic valve is normal in structure and function. Aortic valve regurgitation is not visualized. No aortic stenosis is present.  5. The inferior vena cava is normal in size with greater than 50% respiratory variability, suggesting right atrial pressure of 3 mmHg.  6. A small pericardial effusion is present. The pericardial effusion is circumferential. There is no evidence of cardiac tamponade.  7. limited incomplete study due to uncooperative patient. FINDINGS  Left Ventricle: Left ventricular ejection fraction, by estimation, is 55 to 60%. The left ventricle has normal function. The left ventricle has no regional wall motion  abnormalities. The left ventricular internal cavity size was normal in size. There is  no left ventricular hypertrophy. Left ventricular diastolic function could not be evaluated. Right Ventricle: The right ventricular size is normal. No increase in right ventricular wall thickness. Right ventricular systolic function is normal. Tricuspid regurgitation signal is inadequate for assessing  PA pressure. Left Atrium: Left atrial size was normal in size. Right Atrium: Right atrial size was normal in size. Pericardium: A small pericardial effusion is present. The pericardial effusion is circumferential. There is no evidence of cardiac tamponade. Mitral Valve: The mitral valve is normal in structure and function. Normal mobility of the mitral valve leaflets. Trivial mitral valve regurgitation. No evidence of mitral valve stenosis. Tricuspid Valve: The tricuspid valve is normal in structure. Tricuspid valve regurgitation is not demonstrated. No evidence of tricuspid stenosis. Aortic Valve: The aortic valve is normal in structure and function. Aortic valve regurgitation is not visualized. No aortic stenosis is present. Pulmonic Valve: The pulmonic valve was normal in structure. Pulmonic valve regurgitation is not visualized. No evidence of pulmonic stenosis. Aorta: The aortic root is normal in size and structure. Venous: The inferior vena cava is normal in size with greater than 50% respiratory variability, suggesting right atrial pressure of 3 mmHg. IAS/Shunts: No atrial level shunt detected by color flow Doppler.  LEFT VENTRICLE PLAX 2D LVIDd:         4.89 cm LVIDs:         2.96 cm LV PW:         1.06 cm LV IVS:        1.04 cm LVOT diam:     1.70 cm LVOT Area:     2.27 cm  LEFT ATRIUM         Index LA diam:    2.90 cm 1.87 cm/m   AORTA Ao Root diam: 2.20 cm  SHUNTS Systemic Diam: 1.70 cm Lorine Bears MD Electronically signed by Lorine Bears MD Signature Date/Time: 04/14/2019/8:00:09 AM    Final     Cardiac Studies     Echo 04/13/2019 1. Left ventricular ejection fraction, by estimation, is 55 to 60%. The  left ventricle has normal function. The left ventricle has no regional  wall motion abnormalities. There is apical akinesis with no evidence of  apical thrombus.  2. Right ventricular systolic function is normal. The right ventricular  size is normal. Tricuspid regurgitation signal is inadequate for assessing  PA pressure.  3. The mitral valve is normal in structure and function. Trivial mitral  valve regurgitation. No evidence of mitral stenosis.  4. The aortic valve is normal in structure and function. Aortic valve  regurgitation is not visualized. No aortic stenosis is present.  5. The inferior vena cava is normal in size with greater than 50%  respiratory variability, suggesting right atrial pressure of 3 mmHg.  6. A small pericardial effusion is present. The pericardial effusion is  circumferential. There is no evidence of cardiac tamponade.  7. limited incomplete study due to uncooperative patient.   LHC 04/01/2019: Conclusions: 1. Single vessel coronary artery disease with occlusion of the apical LAD. Otherwise, there is no angiographically significant coronary artery disease. 2. Apical akinesis with otherwise hyperdynamic left ventricular systolic function. 3. Mildly elevated left ventricular filling pressure.  Recommendations: 1. Medical therapy, as apical LAD occlusion is too small/distal for percutaneous intervention (vessel diameter less than 2 mm). We will titrate nitroglycerin infusion for relief of chest pain and start heparin infusion 2 hours after TR band has been removed (to be continued for 48 hours). 2. Dual antiplatelet therapy with aspirin and clopidogrel. 3. High intensity statin therapy. __________  Diagnostic Dominance: Right Left Main  Vessel is large. Vessel is angiographically normal.  Left Anterior Descending  Vessel is large. The vessel exhibits  minimal luminal irregularities.  Dist  LAD lesion 100% stenosed  Dist LAD lesion is 100% stenosed. Vessel is the culprit lesion.  First Diagonal Branch  Vessel is moderate in size.  Second Diagonal Branch  Vessel is moderate in size.  Third Diagonal Branch  Vessel is small in size.  Left Circumflex  Vessel is large. Vessel is angiographically normal.  First Obtuse Marginal Branch  Vessel is small in size.  Second Obtuse Marginal Branch  Vessel is large in size.  Third Obtuse Marginal Branch  Vessel is small in size.  Right Coronary Artery  Vessel is large. Vessel is angiographically normal.  Right Posterior Descending Artery  Vessel is moderate in size.  Right Posterior Atrioventricular Artery  Vessel is moderate in size.  Intervention  No interventions have been documented. Wall Motion       Resting               Left Heart  Left Ventricle The left ventricular size is normal. The left ventricular systolic function is normal. LV end diastolic pressure is mildly elevated. LVEDP 20-25 mmHg. The left ventricular ejection fraction is 55-65% by visual estimate. There are LV function abnormalities due to segmental dysfunction.  Aortic Valve There is no aortic valve stenosis.  Coronary Diagrams  Diagnostic Dominance: Right   _____________    2D echo 04/02/2019: 1. Left ventricular ejection fraction, by estimation, is 60 to 65%. The  left ventricle has normal function. The left ventrical demonstrates  regional wall motion abnormalities (see scoring diagram/findings for  description). There is mildly increased left  ventricular hypertrophy. Left ventricular diastolic parameters are  consistent with Grade II diastolic dysfunction (pseudonormalization). LV  inferoapical hypokinesis noted.  2. Right ventricular systolic function is normal. The right ventricular  size is normal. There is normal pulmonary artery systolic pressure.  3. No left atrial/left atrial  appendage thrombus was detected.  4. The mitral valve is grossly normal. Mild mitral valve regurgitation.  5. The aortic valve is normal in structure and function. Aortic valve  regurgitation is not visualized.  6. The inferior vena cava is normal in size with greater than 50%  respiratory variability, suggesting right atrial pressure of 3 mmHg.    Patient Profile     39 y.o. female with history of postpartum DVT, nephrolithiasis, opioid dependence on Suboxone, anemia, and recent anterior lateral ST segment elevation myocardial infarction.  Assessment & Plan    Dressler syndrome Pericarditis --Recent anterolateral STEMI with discharge 04/04/19. Presented 04/13/19 to [facility] again with pleuritic chest pain, approximately 10 days following her anterior apical myocardial infarction.  Continues to describe CP as pleuritic and preventing her from getting a deep breath then.  She states it is better when sitting forward and worse when lying flat.  She also notes that it radiates to her left shoulder.  Friction rub heard on exam.  Echo as above showed small pericardial effusion and EF 55-60. HS Tn minimally elevated and flat trending, which is inconsistent with ACS. EKG shows evolving anterior apical myocardial infarction. Suspect pericarditis 2/2 recent infarction. Heparin has been therefore discontinued. Continue pericarditis tx with colchicine 0.6mg  BID and ibuprofen 800mg  TID. Ibuprofen should only be continued 1 week. Added PPI today given abdominal pain reported on DAPT and ibuprofen. Schedule follow-up as an outpatient. Will need to repeat echo to reassess pericardial effusion as an outpatient. She has an existing appointment with Dr. Saunders Revel 04/16/19.  Recent STEMI --Reports CP in setting of above and more consistent with pericarditis. Recent STEMI due to dLAD  occlusion with HS Tn peak of 6484 . Most recent cath above. Continue PTA medications, including DAPT with ASA and Plavix. As above, ibuprofen  should only be continued 1 week. Continue BB with low dose Coreg. F/u scheduled with Dr. Okey Dupre as above.   Hypokalemia --K still low at 3.7. Replete with goal 4.0. Check Mg with goal 2.0. Recommend BMET at follow-up given history of hypokalemia.   HLD --Continue statin. Follow-up lipid and liver function was recommended in approximately 4-6 weeks or ~ 05/12/19 but performed this admission with LDL 40 and at goal <70.  Anemia --Chronic anemia. Most recent Hgb down from previous baseline and 9.8 with previous Hgb 10.8. Workup per IM. Recommend close monitoring for s/sx of bleeding at discharge and follow-up on DAPT with recommendation for PPI on DAPT with ibuprofen.   Abdominal pain --Consider gastritis on DAPT with ibuprofen. Recommend PPI. Monitor closely for s/sx of bleeding. Further workup of anemia or GI consult per IM.  Tobacco use, Alcohol use, Polysubstance use --Cessation discussed during our visit today.  Otalgia --She continues to report ear pain, which was reported at her previous admission. Recommend follow-up with PCP.    For questions or updates, please contact [facility] Please consult [email or web address] for contact info under       Signed, Lennon Alstrom, PA-C  04/14/2019, 10:55 AM

## 2019-04-14 NOTE — Discharge Summary (Addendum)
[facility] [place] [facility]   PATIENT NAME: Molly Dickerson    MR#:  [id number]  DATE OF BIRTH:  Jul 24, 1980  DATE OF ADMISSION:  04/13/2019 ADMITTING PHYSICIAN: Enedina Finner, MD  DATE OF DISCHARGE: 04/14/2019  PRIMARY CARE PHYSICIAN: Patient, No Pcp Per    ADMISSION DIAGNOSIS:  Unstable angina (HCC) [I20.0] Chest pain, unspecified type [R07.9] Pericarditis [I31.9] Chest pain [R07.9]  DISCHARGE DIAGNOSIS:  Dressler's syndrome/Acute Pericarditis  H/o STEMI (anterior Apical) s/p recent cath  SECONDARY DIAGNOSIS:   Past Medical History:  Diagnosis Date  . Anxiety   . Chronic kidney disease   . Depression   . Drug addiction [facility])    h/o addiction to pain pills  . DVT (deep venous thrombosis) (HCC) 2010   post partum  . Kidney stones   . MI (myocardial infarction) [facility])     HOSPITAL COURSE:  39 year old woman PMH STEMI 04/01/2019 with recommendation for medical management as single-vessel disease was too small for intervention, discharged on dual antiplatelet therapy, presented with chest pain similar to her previous STEMI.  Admitted for unstable angina.  Acute Pericarditis/dressler's syndrome -recent acute ant apical MI 10 days ago -trop 24--19 -EKG no acute ST elevation, t wave inversion and q wave in ant leads -chest pain with deep breathing.  -no vomiting.  -Heparin gtt d/ced -CTA chest was ordered--pt did not want to do it -Echo reviewed by Dr Kirke Corin-- small pericardial effusion-- recommends colchicine 0.6 mg bid x 30 days and po ibuprofen -called med management clinic for colchicine--don't have any in stock--pt has to do paper work to get meds. Unable to afford. D/w dr Clydia Llano now cont ibuprofen 600 mg tid/wm and f/u cardiology as out pt.  Recent Coronary artery disease, status post STEMI 04/01/2019, left heart cath showed single-vessel disease and medical management was recommended with aspirin, Plavix and statin. --Continue aspirin, Plavix,  statin, BB  Normocytic anemia --Stable.  Follow-up as an outpatient.  H/o Substance abuse.  On Suboxone as an outpatient. Managed by Dr Willa Rough in [place]  Will discharge later to home. Pt agreeable. Advised to f/u cardiology as out pt and f/u med management clinic for her meds. Voiced understanding  CONSULTS OBTAINED:  Treatment Team:  Antonieta Iba, MD Nahser, Deloris Ping, MD  DRUG ALLERGIES:  No Known Allergies  DISCHARGE MEDICATIONS:   Allergies as of 04/14/2019   No Known Allergies     Medication List    STOP taking these medications   ketorolac 10 MG tablet Commonly known as: TORADOL     TAKE these medications   aspirin 81 MG chewable tablet Chew 1 tablet (81 mg total) by mouth daily.   atorvastatin 80 MG tablet Commonly known as: LIPITOR Take 1 tablet (80 mg total) by mouth daily at 6 PM.   buprenorphine-naloxone 8-2 mg Subl SL tablet Commonly known as: SUBOXONE Place 1 tablet under the tongue 3 (three) times daily.   busPIRone 10 MG tablet Commonly known as: BUSPAR Take 10 mg by mouth 2 (two) times daily.   clopidogrel 75 MG tablet Commonly known as: PLAVIX Take 1 tablet (75 mg total) by mouth daily.   colchicine 0.6 MG tablet Take 1 tablet (0.6 mg total) by mouth 2 (two) times daily.   ibuprofen 600 MG tablet Commonly known as: ADVIL Take 1 tablet (600 mg total) by mouth 3 (three) times daily for 7 days. And then as needed Pt to take meds with meals   metoprolol tartrate 25 MG tablet  Commonly known as: LOPRESSOR Take 0.5 tablets (12.5 mg total) by mouth 2 (two) times daily.   nicotine 14 mg/24hr patch Commonly known as: NICODERM CQ - dosed in mg/24 hours Place 1 patch (14 mg total) onto the skin daily.   pantoprazole 40 MG tablet Commonly known as: PROTONIX Take 1 tablet (40 mg total) by mouth daily.       If you experience worsening of your admission symptoms, develop shortness of breath, life threatening emergency, suicidal or  homicidal thoughts you must seek medical attention immediately by calling 911 or calling your MD immediately  if symptoms less severe.  You Must read complete instructions/literature along with all the possible adverse reactions/side effects for all the Medicines you take and that have been prescribed to you. Take any new Medicines after you have completely understood and accept all the possible adverse reactions/side effects.   Please note  You were cared for by a hospitalist during your hospital stay. If you have any questions about your discharge medications or the care you received while you were in the hospital after you are discharged, you can call the unit and asked to speak with the hospitalist on call if the hospitalist that took care of you is not available. Once you are discharged, your primary care physician will handle any further medical issues. Please note that NO REFILLS for any discharge medications will be authorized once you are discharged, as it is imperative that you return to your primary care physician (or establish a relationship with a primary care physician if you do not have one) for your aftercare needs so that they can reassess your need for medications and monitor your lab values. Today   SUBJECTIVE   Chest pain some on taking deep breath  VITAL SIGNS:  Blood pressure 105/69, pulse 82, temperature 98 F (36.7 C), temperature source Oral, resp. rate 19, height 5' (1.524 m), weight 58.5 kg, last menstrual period 03/29/2019, SpO2 98 %, unknown if currently breastfeeding.  I/O:    Intake/Output Summary (Last 24 hours) at 04/14/2019 1326 Last data filed at 04/14/2019 0920 Gross per 24 hour  Intake 360 ml  Output --  Net 360 ml    PHYSICAL EXAMINATION:  GENERAL:  39 y.o.-year-old patient lying in the bed with no acute distress.  EYES: Pupils equal, round, reactive to light and accommodation. No scleral icterus.  HEENT: Head atraumatic, normocephalic. Oropharynx  and nasopharynx clear.  NECK:  Supple, no jugular venous distention. No thyroid enlargement, no tenderness.  LUNGS: Normal breath sounds bilaterally, no wheezing, rales,rhonchi or crepitation. No use of accessory muscles of respiration.  CARDIOVASCULAR: S1, S2 normal. No murmurs, rubs, or gallops. I could not appreciate rub today ABDOMEN: Soft, non-tender, non-distended. Bowel sounds present. No organomegaly or mass.  EXTREMITIES: No pedal edema, cyanosis, or clubbing.  NEUROLOGIC: Cranial nerves II through XII are intact. Muscle strength 5/5 in all extremities. Sensation intact. Gait not checked.  PSYCHIATRIC: The patient is alert and oriented x 3.  SKIN: No obvious rash, lesion, or ulcer.   DATA REVIEW:   CBC  Recent Labs  Lab 04/14/19 0501  WBC 9.3  HGB 9.8*  HCT 29.7*  PLT 256    Chemistries  Recent Labs  Lab 04/14/19 0501  NA 140  K 3.7  CL 108  CO2 23  GLUCOSE 131*  BUN 12  CREATININE 0.82  CALCIUM 8.1*    Microbiology Results   Recent Results (from the past 240 hour(s))  Respiratory Panel by  RT PCR (Flu A&B, Covid) - Nasopharyngeal Swab     Status: None   Collection Time: 04/13/19  6:54 AM   Specimen: Nasopharyngeal Swab  Result Value Ref Range Status   SARS Coronavirus 2 by RT PCR NEGATIVE NEGATIVE Final    Comment: (NOTE) SARS-CoV-2 target nucleic acids are NOT DETECTED. The SARS-CoV-2 RNA is generally detectable in upper respiratoy specimens during the acute phase of infection. The lowest concentration of SARS-CoV-2 viral copies this assay can detect is 131 copies/mL. A negative result does not preclude SARS-Cov-2 infection and should not be used as the sole basis for treatment or other patient management decisions. A negative result may occur with  improper specimen collection/handling, submission of specimen other than nasopharyngeal swab, presence of viral mutation(s) within the areas targeted by this assay, and inadequate number of viral  copies (<131 copies/mL). A negative result must be combined with clinical observations, patient history, and epidemiological information. The expected result is Negative. Fact Sheet for Patients:  https://www.moore.com/ Fact Sheet for Healthcare Providers:  https://www.young.biz/ This test is not yet ap proved or cleared by the [place] FDA and  has been authorized for detection and/or diagnosis of SARS-CoV-2 by FDA under an Emergency Use Authorization (EUA). This EUA will remain  in effect (meaning this test can be used) for the duration of the COVID-19 declaration under Section 564(b)(1) of the Act, 21 U.S.C. section 360bbb-3(b)(1), unless the authorization is terminated or revoked sooner.    Influenza A by PCR NEGATIVE NEGATIVE Final   Influenza B by PCR NEGATIVE NEGATIVE Final    Comment: (NOTE) The Xpert Xpress SARS-CoV-2/FLU/RSV assay is intended as an aid in  the diagnosis of influenza from Nasopharyngeal swab specimens and  should not be used as a sole basis for treatment. Nasal washings and  aspirates are unacceptable for Xpert Xpress SARS-CoV-2/FLU/RSV  testing. Fact Sheet for Patients: https://www.moore.com/ Fact Sheet for Healthcare Providers: https://www.young.biz/ This test is not yet approved or cleared by the [place] FDA and  has been authorized for detection and/or diagnosis of SARS-CoV-2 by  FDA under an Emergency Use Authorization (EUA). This EUA will remain  in effect (meaning this test can be used) for the duration of the  Covid-19 declaration under Section 564(b)(1) of the Act, 21  U.S.C. section 360bbb-3(b)(1), unless the authorization is  terminated or revoked. Performed at [facility], [address] [place], [place] [address]     RADIOLOGY:  DG Chest Portable 1 View  Result Date: 04/13/2019 CLINICAL DATA:  Chest pain EXAM: PORTABLE CHEST 1 VIEW  COMPARISON:  04/01/2019 chest radiograph. FINDINGS: Stable cardiomediastinal silhouette with mild cardiomegaly. No pneumothorax. No pleural effusion. No overt pulmonary edema. Mild left basilar patchy opacity. IMPRESSION: 1. Stable mild cardiomegaly without overt pulmonary edema. 2. Mild patchy left basilar lung opacity, favor atelectasis. Electronically Signed   By: Delbert Phenix M.D.   On: 04/13/2019 04:24   ECHOCARDIOGRAM COMPLETE  Result Date: 04/14/2019    ECHOCARDIOGRAM REPORT   Patient Name:   DODI LEU Date of Exam: 04/13/2019 Medical Rec #:  [id number]        Height:       60.0 in Accession #:    [id number]       Weight:       129.0 lb Date of Birth:  [birth year]       BSA:          1.549 m Patient Age:    39 years  BP:           108/63 mmHg Patient Gender: F                HR:           76 bpm. Exam Location:  [facility] Procedure: 2D Echo Indications:     CAD Native Vessel 414.01/ I25.10  History:         Patient has prior history of Echocardiogram examinations, most                  recent 04/02/2019.  Sonographer:     Wonda Cerise RDCS Referring Phys:  [id number] Deloris Ping NAHSER Diagnosing Phys: Lorine Bears MD  Sonographer Comments: Image acquisition challenging due to uncooperative patient. IMPRESSIONS  1. Left ventricular ejection fraction, by estimation, is 55 to 60%. The left ventricle has normal function. The left ventricle has no regional wall motion abnormalities. There is apical akinesis with no evidence of apical thrombus.  2. Right ventricular systolic function is normal. The right ventricular size is normal. Tricuspid regurgitation signal is inadequate for assessing PA pressure.  3. The mitral valve is normal in structure and function. Trivial mitral valve regurgitation. No evidence of mitral stenosis.  4. The aortic valve is normal in structure and function. Aortic valve regurgitation is not visualized. No aortic stenosis is present.  5. The inferior vena cava is normal in size with  greater than 50% respiratory variability, suggesting right atrial pressure of 3 mmHg.  6. A small pericardial effusion is present. The pericardial effusion is circumferential. There is no evidence of cardiac tamponade.  7. limited incomplete study due to uncooperative patient. FINDINGS  Left Ventricle: Left ventricular ejection fraction, by estimation, is 55 to 60%. The left ventricle has normal function. The left ventricle has no regional wall motion abnormalities. The left ventricular internal cavity size was normal in size. There is  no left ventricular hypertrophy. Left ventricular diastolic function could not be evaluated. Right Ventricle: The right ventricular size is normal. No increase in right ventricular wall thickness. Right ventricular systolic function is normal. Tricuspid regurgitation signal is inadequate for assessing PA pressure. Left Atrium: Left atrial size was normal in size. Right Atrium: Right atrial size was normal in size. Pericardium: A small pericardial effusion is present. The pericardial effusion is circumferential. There is no evidence of cardiac tamponade. Mitral Valve: The mitral valve is normal in structure and function. Normal mobility of the mitral valve leaflets. Trivial mitral valve regurgitation. No evidence of mitral valve stenosis. Tricuspid Valve: The tricuspid valve is normal in structure. Tricuspid valve regurgitation is not demonstrated. No evidence of tricuspid stenosis. Aortic Valve: The aortic valve is normal in structure and function. Aortic valve regurgitation is not visualized. No aortic stenosis is present. Pulmonic Valve: The pulmonic valve was normal in structure. Pulmonic valve regurgitation is not visualized. No evidence of pulmonic stenosis. Aorta: The aortic root is normal in size and structure. Venous: The inferior vena cava is normal in size with greater than 50% respiratory variability, suggesting right atrial pressure of 3 mmHg. IAS/Shunts: No atrial level  shunt detected by color flow Doppler.  LEFT VENTRICLE PLAX 2D LVIDd:         4.89 cm LVIDs:         2.96 cm LV PW:         1.06 cm LV IVS:        1.04 cm LVOT diam:     1.70 cm LVOT Area:  2.27 cm  LEFT ATRIUM         Index LA diam:    2.90 cm 1.87 cm/m   AORTA Ao Root diam: 2.20 cm  SHUNTS Systemic Diam: 1.70 cm Lorine Bears MD Electronically signed by Lorine Bears MD Signature Date/Time: 04/14/2019/8:00:09 AM    Final      CODE STATUS:     Code Status Orders  (From admission, onward)         Start     Ordered   04/13/19 0618  Full code  Continuous     04/13/19 0624        Code Status History    Date Active Date Inactive Code Status Order ID Comments User Context   04/02/2019 0020 04/04/2019 2144 Full Code [id number]  End, Cristal Deer, MD Inpatient   Advance Care Planning Activity       TOTAL TIME TAKING CARE OF THIS PATIENT: *40* minutes.    Enedina Finner M.D  [facility]    CC: Primary care physician; Patient, No Pcp Per

## 2019-04-15 LAB — HIV ANTIBODY (ROUTINE TESTING W REFLEX): HIV Screen 4th Generation wRfx: NONREACTIVE — AB

## 2019-04-16 ENCOUNTER — Ambulatory Visit: Payer: Self-pay | Admitting: Internal Medicine

## 2019-04-16 NOTE — Progress Notes (Deleted)
Follow-up Outpatient Visit Date: 04/16/2019  Primary Care Provider: Patient, No Pcp Per No address on file  Chief Complaint: ***  HPI:  Molly Dickerson is a 39 y.o. female with history of history of recent late presenting STEMI due to occlusion of the apical LAD complicated by Dressler syndrome, postpartum DVT, kidney stones, and polysubstance abuse on Suboxone, who presents for follow-up of coronary disease and pericarditis.  She presented to [facility] on 04/02/2019 with acute onset of severe pleuritic chest pain and was found to have anterolateral and inferior ST elevation.  She was taken for emergent left heart catheterization that demonstrated flush occlusion of the apical LAD but otherwise no significant CAD.  Troponin had already peaked at that point and it was suspected that she was a late presenting MI with development of pericarditis.  Hospital course was complicated by persistent chest pain requiring narcotics as well as question of other illicit substance use during her hospitalization.  She was discharged on antiplatelet therapy and colchicine but returned a week later due to recurrent pleuritic chest pain thought to be due to pericarditis.  She was prescribed colchicine and NSAIDs but was unable to obtain colchicine, as it was not available through the medication assistance clinic.  She was discharged on standing ibuprofen 2 days ago.  --------------------------------------------------------------------------------------------------  Past Medical History:  Diagnosis Date  . Anxiety   . Chronic kidney disease   . Depression   . Drug addiction [facility])    h/o addiction to pain pills  . DVT (deep venous thrombosis) (HCC) 2010   post partum  . Kidney stones   . MI (myocardial infarction) [facility])    Past Surgical History:  Procedure Laterality Date  . KIDNEY STONE SURGERY    . LAPAROSCOPIC TUBAL LIGATION  09/26/2010   Procedure: LAPAROSCOPIC TUBAL LIGATION;  Surgeon: Tereso Newcomer, MD;   Location: [facility];  Service: Gynecology;  Laterality: Bilateral;  . LEFT HEART CATH AND CORONARY ANGIOGRAPHY N/A 04/01/2019   Procedure: LEFT HEART CATH AND CORONARY ANGIOGRAPHY;  Surgeon: Yvonne Kendall, MD;  Location: [facility] INVASIVE CV LAB;  Service: Cardiovascular;  Laterality: N/A;  . NASAL SINUS SURGERY      No outpatient medications have been marked as taking for the 04/16/19 encounter (Appointment) with Jamale Spangler, Cristal Deer, MD.    Allergies: Patient has no known allergies.  Social History   Tobacco Use  . Smoking status: Current Every Day Smoker    Packs/day: 0.50    Types: Cigarettes  . Smokeless tobacco: Never Used  Substance Use Topics  . Alcohol use: Yes    Comment: few drinks a week.  . Drug use: Not Currently    Comment: history of opioid abuse; on suboxone x 8 hears    Family History  Problem Relation Age of Onset  . Heart disease Neg Hx     Review of Systems: A 12-system review of systems was performed and was negative except as noted in the HPI.  --------------------------------------------------------------------------------------------------  Physical Exam: LMP 03/29/2019 (Approximate)   General:  *** HEENT: No conjunctival pallor or scleral icterus. Facemask in place. Neck: Supple without lymphadenopathy, thyromegaly, JVD, or HJR. Lungs: Normal work of breathing. Clear to auscultation bilaterally without wheezes or crackles. Heart: Regular rate and rhythm without murmurs, rubs, or gallops. Non-displaced PMI. Abd: Bowel sounds present. Soft, NT/ND without hepatosplenomegaly Ext: No lower extremity edema. Radial, PT, and DP pulses are 2+ bilaterally. Skin: Warm and dry without rash.  EKG:  ***  Lab Results  Component Value  Date   WBC 9.3 04/14/2019   HGB 9.8 (L) 04/14/2019   HCT 29.7 (L) 04/14/2019   MCV 96.7 04/14/2019   PLT 256 04/14/2019    Lab Results  Component Value Date   NA 140 04/14/2019   K 3.7 04/14/2019   CL 108 04/14/2019   CO2 23  04/14/2019   BUN 12 04/14/2019   CREATININE 0.82 04/14/2019   GLUCOSE 131 (H) 04/14/2019   ALT 11 04/02/2019    Lab Results  Component Value Date   CHOL 96 04/14/2019   HDL 45 04/14/2019   LDLCALC 40 04/14/2019   TRIG 53 04/14/2019   CHOLHDL 2.1 04/14/2019    --------------------------------------------------------------------------------------------------  ASSESSMENT AND PLAN: Molly Dickerson Phenix Grein, MD 04/16/2019 7:14 AM

## 2019-04-17 ENCOUNTER — Encounter: Payer: Self-pay | Admitting: Internal Medicine

## 2019-05-15 ENCOUNTER — Telehealth: Payer: Self-pay | Admitting: Pharmacy Technician

## 2019-05-15 NOTE — Telephone Encounter (Signed)
Patient failed to provide 2021 proof of income.  No additional medication assistance will be provided by [facility] without the required proof of income documentation.  Patient notified by letter.  Molly Dickerson Care Manager [facility]   [address] [place], [place]  [address]     This is to inform you that you are no longer eligible to receive medication assistance at [facility].  The reason(s) are:    _____Your total gross monthly household income exceeds 250% of the Federal Poverty Level.   _____Tangible assets (savings, checking, stocks/bonds, pension, retirement, etc.) exceeds our limit  _____You are eligible to receive benefits from Medicaid, [facility] or HIV Medication              Assistance Program _____You are eligible to receive benefits from a Medicare Part "D" plan _____You have prescription insurance  _____You are not an [place] resident __X__Failure to provide all requested proof of income information for 2021.    Medication assistance will resume once all requested financial information has been returned to our clinic.  If you have questions, please contact our clinic at [phone].    Thank you,  [facility] 

## 2019-06-21 ENCOUNTER — Observation Stay
Admission: EM | Admit: 2019-06-21 | Discharge: 2019-06-22 | Discharge status: Against Medical Advice | Payer: Self-pay | Attending: Internal Medicine | Admitting: Internal Medicine

## 2019-06-21 ENCOUNTER — Other Ambulatory Visit: Payer: Self-pay

## 2019-06-21 ENCOUNTER — Observation Stay: Payer: Self-pay

## 2019-06-21 ENCOUNTER — Emergency Department: Payer: Self-pay

## 2019-06-21 DIAGNOSIS — F17210 Nicotine dependence, cigarettes, uncomplicated: Secondary | ICD-10-CM | POA: Insufficient documentation

## 2019-06-21 DIAGNOSIS — Z7902 Long term (current) use of antithrombotics/antiplatelets: Secondary | ICD-10-CM | POA: Insufficient documentation

## 2019-06-21 DIAGNOSIS — F32A Depression, unspecified: Secondary | ICD-10-CM | POA: Diagnosis present

## 2019-06-21 DIAGNOSIS — Z86718 Personal history of other venous thrombosis and embolism: Secondary | ICD-10-CM | POA: Insufficient documentation

## 2019-06-21 DIAGNOSIS — Z79899 Other long term (current) drug therapy: Secondary | ICD-10-CM | POA: Insufficient documentation

## 2019-06-21 DIAGNOSIS — N189 Chronic kidney disease, unspecified: Secondary | ICD-10-CM | POA: Insufficient documentation

## 2019-06-21 DIAGNOSIS — F329 Major depressive disorder, single episode, unspecified: Secondary | ICD-10-CM | POA: Insufficient documentation

## 2019-06-21 DIAGNOSIS — Z7982 Long term (current) use of aspirin: Secondary | ICD-10-CM | POA: Insufficient documentation

## 2019-06-21 DIAGNOSIS — F1120 Opioid dependence, uncomplicated: Secondary | ICD-10-CM | POA: Insufficient documentation

## 2019-06-21 DIAGNOSIS — Z87442 Personal history of urinary calculi: Secondary | ICD-10-CM | POA: Insufficient documentation

## 2019-06-21 DIAGNOSIS — I25110 Atherosclerotic heart disease of native coronary artery with unstable angina pectoris: Secondary | ICD-10-CM | POA: Insufficient documentation

## 2019-06-21 DIAGNOSIS — G40909 Epilepsy, unspecified, not intractable, without status epilepticus: Secondary | ICD-10-CM | POA: Insufficient documentation

## 2019-06-21 DIAGNOSIS — F10239 Alcohol dependence with withdrawal, unspecified: Principal | ICD-10-CM | POA: Insufficient documentation

## 2019-06-21 DIAGNOSIS — I252 Old myocardial infarction: Secondary | ICD-10-CM | POA: Insufficient documentation

## 2019-06-21 DIAGNOSIS — Z20822 Contact with and (suspected) exposure to covid-19: Secondary | ICD-10-CM | POA: Insufficient documentation

## 2019-06-21 DIAGNOSIS — I2510 Atherosclerotic heart disease of native coronary artery without angina pectoris: Secondary | ICD-10-CM | POA: Diagnosis present

## 2019-06-21 DIAGNOSIS — I4510 Unspecified right bundle-branch block: Secondary | ICD-10-CM | POA: Insufficient documentation

## 2019-06-21 DIAGNOSIS — R569 Unspecified convulsions: Secondary | ICD-10-CM

## 2019-06-21 DIAGNOSIS — F13239 Sedative, hypnotic or anxiolytic dependence with withdrawal, unspecified: Secondary | ICD-10-CM | POA: Insufficient documentation

## 2019-06-21 DIAGNOSIS — Z7901 Long term (current) use of anticoagulants: Secondary | ICD-10-CM | POA: Insufficient documentation

## 2019-06-21 DIAGNOSIS — F419 Anxiety disorder, unspecified: Secondary | ICD-10-CM | POA: Insufficient documentation

## 2019-06-21 LAB — COMPREHENSIVE METABOLIC PANEL
ALT: 21 U/L (ref 0–44)
AST: 38 U/L (ref 15–41)
Albumin: 3.9 g/dL (ref 3.5–5.0)
Alkaline Phosphatase: 70 U/L (ref 38–126)
Anion gap: 13 (ref 5–15)
BUN: 12 mg/dL (ref 6–20)
CO2: 17 mmol/L — ABNORMAL LOW (ref 22–32)
Calcium: 8.1 mg/dL — ABNORMAL LOW (ref 8.9–10.3)
Chloride: 106 mmol/L (ref 98–111)
Creatinine, Ser: 1.03 mg/dL — ABNORMAL HIGH (ref 0.44–1.00)
GFR calc Af Amer: >60 mL/min (ref >60)
GFR calc non Af Amer: >60 mL/min (ref >60)
Glucose, Bld: 93 mg/dL (ref 70–99)
Potassium: 3.6 mmol/L (ref 3.5–5.1)
Sodium: 136 mmol/L (ref 135–145)
Total Bilirubin: 1 mg/dL (ref 0.3–1.2)
Total Protein: 7.8 g/dL (ref 6.5–8.1)

## 2019-06-21 LAB — URINE DRUG SCREEN, QUALITATIVE (ARMC ONLY)
Amphetamines, Ur Screen: NOT DETECTED
Barbiturates, Ur Screen: NOT DETECTED
Benzodiazepine, Ur Scrn: NOT DETECTED
Cannabinoid 50 Ng, Ur ~~LOC~~: NOT DETECTED
Cocaine Metabolite,Ur ~~LOC~~: NOT DETECTED
MDMA (Ecstasy)Ur Screen: NOT DETECTED
Methadone Scn, Ur: NOT DETECTED
Opiate, Ur Screen: NOT DETECTED
Phencyclidine (PCP) Ur S: NOT DETECTED
Tricyclic, Ur Screen: NOT DETECTED

## 2019-06-21 LAB — CBC
HCT: 39 % (ref 36.0–46.0)
Hemoglobin: 13.2 g/dL (ref 12.0–15.0)
MCH: 30.4 pg (ref 26.0–34.0)
MCHC: 33.8 g/dL (ref 30.0–36.0)
MCV: 89.9 fL (ref 80.0–100.0)
Platelets: 200 10*3/uL (ref 150–400)
RBC: 4.34 MIL/uL (ref 3.87–5.11)
RDW: 12.7 % (ref 11.5–15.5)
WBC: 4.4 10*3/uL (ref 4.0–10.5)
nRBC: 0 % (ref 0.0–0.2)

## 2019-06-21 LAB — URINALYSIS, COMPLETE (UACMP) WITH MICROSCOPIC
Bacteria, UA: NONE SEEN
Bilirubin Urine: NEGATIVE
Glucose, UA: NEGATIVE mg/dL
Ketones, ur: NEGATIVE mg/dL
Leukocytes,Ua: NEGATIVE
Nitrite: NEGATIVE
Protein, ur: 100 mg/dL — AB
Specific Gravity, Urine: 1.015 (ref 1.005–1.030)
pH: 5 (ref 5.0–8.0)

## 2019-06-21 LAB — RESPIRATORY PANEL BY RT PCR (FLU A&B, COVID)
Influenza A by PCR: NEGATIVE
Influenza B by PCR: NEGATIVE
SARS Coronavirus 2 by RT PCR: NEGATIVE

## 2019-06-21 LAB — TROPONIN I (HIGH SENSITIVITY): Troponin I (High Sensitivity): 12 ng/L (ref <18)

## 2019-06-21 LAB — MAGNESIUM: Magnesium: 2.2 mg/dL (ref 1.7–2.4)

## 2019-06-21 LAB — POCT PREGNANCY, URINE: Preg Test, Ur: NEGATIVE

## 2019-06-21 LAB — PHOSPHORUS: Phosphorus: 2.8 mg/dL (ref 2.5–4.6)

## 2019-06-21 MED ORDER — THIAMINE HCL 100 MG PO TABS
100.0000 mg | ORAL_TABLET | Freq: Every day | ORAL | Status: DC
  Administered 2019-06-22: 100 mg via ORAL
  Filled 2019-06-21: qty 1

## 2019-06-21 MED ORDER — LORAZEPAM 2 MG/ML IJ SOLN: 2.0000 mg | INTRAMUSCULAR | Status: DC | PRN

## 2019-06-21 MED ORDER — ENOXAPARIN SODIUM 40 MG/0.4ML ~~LOC~~ SOLN
40.0000 mg | SUBCUTANEOUS | Status: DC
  Administered 2019-06-21: 22:00:00 40 mg via SUBCUTANEOUS
  Filled 2019-06-21: qty 0.4

## 2019-06-21 MED ORDER — ONDANSETRON HCL 4 MG PO TABS: 4.0000 mg | ORAL_TABLET | Freq: Four times a day (QID) | ORAL | Status: DC | PRN

## 2019-06-21 MED ORDER — LORAZEPAM 1 MG PO TABS
1.0000 mg | ORAL_TABLET | ORAL | Status: DC | PRN
  Filled 2019-06-21: qty 1

## 2019-06-21 MED ORDER — ATORVASTATIN CALCIUM 20 MG PO TABS
80.0000 mg | ORAL_TABLET | Freq: Every day | ORAL | Status: DC
  Administered 2019-06-22: 80 mg via ORAL
  Filled 2019-06-21: qty 4

## 2019-06-21 MED ORDER — ACETAMINOPHEN 650 MG RE SUPP: 650.0000 mg | Freq: Four times a day (QID) | RECTAL | Status: DC | PRN

## 2019-06-21 MED ORDER — COLCHICINE 0.6 MG PO TABS
0.6000 mg | ORAL_TABLET | Freq: Two times a day (BID) | ORAL | Status: DC
  Administered 2019-06-21 – 2019-06-22 (×2): 0.6 mg via ORAL
  Filled 2019-06-21 (×4): qty 1

## 2019-06-21 MED ORDER — BUSPIRONE HCL 5 MG PO TABS
10.0000 mg | ORAL_TABLET | Freq: Two times a day (BID) | ORAL | Status: DC
  Administered 2019-06-21 – 2019-06-22 (×2): 10 mg via ORAL
  Filled 2019-06-21 (×4): qty 2

## 2019-06-21 MED ORDER — CLOPIDOGREL BISULFATE 75 MG PO TABS
75.0000 mg | ORAL_TABLET | Freq: Every day | ORAL | Status: DC
  Administered 2019-06-21 – 2019-06-22 (×2): 75 mg via ORAL
  Filled 2019-06-21 (×2): qty 1

## 2019-06-21 MED ORDER — ACETAMINOPHEN 325 MG PO TABS
650.0000 mg | ORAL_TABLET | Freq: Four times a day (QID) | ORAL | Status: DC | PRN
  Administered 2019-06-21: 650 mg via ORAL
  Filled 2019-06-21: qty 2

## 2019-06-21 MED ORDER — PANTOPRAZOLE SODIUM 40 MG PO TBEC
40.0000 mg | DELAYED_RELEASE_TABLET | Freq: Every day | ORAL | Status: DC
  Administered 2019-06-21 – 2019-06-22 (×2): 40 mg via ORAL
  Filled 2019-06-21 (×2): qty 1

## 2019-06-21 MED ORDER — NICOTINE 14 MG/24HR TD PT24
14.0000 mg | MEDICATED_PATCH | Freq: Every day | TRANSDERMAL | Status: DC
  Administered 2019-06-21 – 2019-06-22 (×2): 14 mg via TRANSDERMAL
  Filled 2019-06-21 (×2): qty 1

## 2019-06-21 MED ORDER — FOLIC ACID 1 MG PO TABS
1.0000 mg | ORAL_TABLET | Freq: Every day | ORAL | Status: DC
  Administered 2019-06-22: 09:00:00 1 mg via ORAL
  Filled 2019-06-21: qty 1

## 2019-06-21 MED ORDER — THIAMINE HCL 100 MG/ML IJ SOLN: 100.0000 mg | Freq: Every day | INTRAMUSCULAR | Status: DC

## 2019-06-21 MED ORDER — ONDANSETRON HCL 4 MG/2ML IJ SOLN: 4.0000 mg | Freq: Four times a day (QID) | INTRAMUSCULAR | Status: DC | PRN

## 2019-06-21 MED ORDER — METOPROLOL TARTRATE 25 MG PO TABS
12.5000 mg | ORAL_TABLET | Freq: Two times a day (BID) | ORAL | Status: DC
  Administered 2019-06-21 – 2019-06-22 (×2): 12.5 mg via ORAL
  Filled 2019-06-21 (×2): qty 1

## 2019-06-21 MED ORDER — LORAZEPAM 2 MG/ML IJ SOLN: 1.0000 mg | INTRAMUSCULAR | Status: DC | PRN

## 2019-06-21 MED ORDER — GADOBUTROL 1 MMOL/ML IV SOLN
7.0000 mL | Freq: Once | INTRAVENOUS | Status: AC | PRN
  Administered 2019-06-21: 7 mL via INTRAVENOUS

## 2019-06-21 MED ORDER — DOXEPIN HCL 25 MG PO CAPS
25.0000 mg | ORAL_CAPSULE | Freq: Every evening | ORAL | Status: DC | PRN
  Administered 2019-06-21: 25 mg via ORAL
  Filled 2019-06-21 (×2): qty 1

## 2019-06-21 MED ORDER — ADULT MULTIVITAMIN W/MINERALS CH
1.0000 | ORAL_TABLET | Freq: Every day | ORAL | Status: DC
  Administered 2019-06-22: 1 via ORAL
  Filled 2019-06-21: qty 1

## 2019-06-21 MED ORDER — BUPRENORPHINE HCL-NALOXONE HCL 8-2 MG SL SUBL
1.0000 | SUBLINGUAL_TABLET | Freq: Three times a day (TID) | SUBLINGUAL | Status: DC
  Administered 2019-06-21 – 2019-06-22 (×4): 1 via SUBLINGUAL
  Filled 2019-06-21 (×4): qty 1

## 2019-06-21 MED ORDER — ASPIRIN 81 MG PO CHEW
81.0000 mg | CHEWABLE_TABLET | Freq: Every day | ORAL | Status: DC
  Administered 2019-06-21 – 2019-06-22 (×2): 81 mg via ORAL
  Filled 2019-06-21 (×2): qty 1

## 2019-06-21 NOTE — ED Notes (Signed)
Report given to Ashley RN

## 2019-06-21 NOTE — ED Triage Notes (Signed)
Pt arrived via EMS from home d/t first time seizure. EMS reports pt boyfriend stating that pt was making the bed and starting having a seizure. Pt is alert and oriented at this time but does not remember having a seizure. Pt reports having an MI a few months ago but does not recall which hospital treated her.

## 2019-06-21 NOTE — H&P (Signed)
History and Physical    Molly Dickerson:[id number] DOB: Jan 07, 1981 DOA: 06/21/2019  PCP: Patient, No Pcp Per   Patient coming from: Home  I have personally briefly reviewed patient's old medical records in [facility]  Chief Complaint: Seizure  HPI: Molly Dickerson is a 39 y.o. female with medical history significant for coronary artery disease, history of DVT and nicotine dependence who was brought into the emergency room by EMS after she had what appears to be a seizure episode at home.  Patient does not recall the event but her boyfriend reports that he was resting in bed when she started shaking and foaming at the mouth.  Patient was said to have urinated and defecated on herself.  But there was no tongue bite.  He was concerned that she may have had a seizure and so he called EMS.  Patient woke up to the EMS providers in her home. Patient has never had a seizure before and denies any illicit drug use. She states that she is on Suboxone. She admits to occasional alcohol use and denies having symptoms of alcohol withdrawal when she does not drink.  She had gone to bed normal the night prior without any problems. She denies having any chest pain, shortness of breath, nausea, vomiting, diaphoresis or palpitations. She had a CT scan of the head done without contrast which showed no evidence of acute intracranial abnormality. No evidence of calvarial fracture.  ED Course: Patient with no known history of seizures who is seen in the emergency room after a witnessed seizure episode at home.  She will be admitted to the hospital for seizure work-up.  Neurology has been consulted.  Review of Systems: As per HPI otherwise 10 point review of systems negative.    Past Medical History:  Diagnosis Date  . Anxiety   . Chronic kidney disease   . Depression   . Drug addiction [facility])    h/o addiction to pain pills  . DVT (deep venous thrombosis) (HCC) 2010   post partum  . Kidney stones     . MI (myocardial infarction) [facility])     Past Surgical History:  Procedure Laterality Date  . KIDNEY STONE SURGERY    . LAPAROSCOPIC TUBAL LIGATION  09/26/2010   Procedure: LAPAROSCOPIC TUBAL LIGATION;  Surgeon: Tereso Newcomer, MD;  Location: [facility];  Service: Gynecology;  Laterality: Bilateral;  . LEFT HEART CATH AND CORONARY ANGIOGRAPHY N/A 04/01/2019   Procedure: LEFT HEART CATH AND CORONARY ANGIOGRAPHY;  Surgeon: Yvonne Kendall, MD;  Location: [facility] INVASIVE CV LAB;  Service: Cardiovascular;  Laterality: N/A;  . NASAL SINUS SURGERY       reports that she has been smoking cigarettes. She has been smoking about 0.50 packs per day. She has never used smokeless tobacco. She reports current alcohol use. She reports previous drug use.  No Known Allergies  Family History  Problem Relation Age of Onset  . Heart disease Neg Hx      Prior to Admission medications   Medication Sig Start Date End Date Taking? Authorizing Provider  aspirin 81 MG chewable tablet Chew 1 tablet (81 mg total) by mouth daily. 04/05/19   Dunn, Raymon Mutton, PA-C  atorvastatin (LIPITOR) 80 MG tablet Take 1 tablet (80 mg total) by mouth daily at 6 PM. 04/04/19   Dunn, Raymon Mutton, PA-C  buprenorphine-naloxone (SUBOXONE) 8-2 mg SUBL SL tablet Place 1 tablet under the tongue 3 (three) times daily. 03/28/19   [provider]  busPIRone (BUSPAR) 10 MG tablet Take 10 mg by mouth 2 (two) times daily. 11/01/18   [provider]  clopidogrel (PLAVIX) 75 MG tablet Take 1 tablet (75 mg total) by mouth daily. 04/05/19   Dunn, Raymon Mutton, PA-C  colchicine 0.6 MG tablet Take 1 tablet (0.6 mg total) by mouth 2 (two) times daily. 04/14/19   Enedina Finner, MD  metoprolol tartrate (LOPRESSOR) 25 MG tablet Take 0.5 tablets (12.5 mg total) by mouth 2 (two) times daily. 04/04/19   Dunn, Raymon Mutton, PA-C  nicotine (NICODERM CQ - DOSED IN MG/24 HOURS) 14 mg/24hr patch Place 1 patch (14 mg total) onto the skin daily. 04/05/19   Dunn, Raymon Mutton, PA-C   pantoprazole (PROTONIX) 40 MG tablet Take 1 tablet (40 mg total) by mouth daily. 04/14/19   Enedina Finner, MD    Physical Exam: Vitals:   06/21/19 1115 06/21/19 1145 06/21/19 1200 06/21/19 1215  BP: (!) 132/92 132/87 124/78 126/83  Pulse: 77 76 72 72  Resp: 10 11 12 11   Temp:      TempSrc:      SpO2: 97% 96% 96% 96%  Weight:      Height:         Vitals:   06/21/19 1115 06/21/19 1145 06/21/19 1200 06/21/19 1215  BP: (!) 132/92 132/87 124/78 126/83  Pulse: 77 76 72 72  Resp: 10 11 12 11   Temp:      TempSrc:      SpO2: 97% 96% 96% 96%  Weight:      Height:        Constitutional: NAD, alert and oriented x 3 Eyes: PERRL, lids and conjunctivae normal ENMT: Mucous membranes are moist.  Neck: normal, supple, no masses, no thyromegaly Respiratory: clear to auscultation bilaterally, no wheezing, no crackles. Normal respiratory effort. No accessory muscle use.  Cardiovascular: Regular rate and rhythm, no murmurs / rubs / gallops. No extremity edema. 2+ pedal pulses. No carotid bruits.  Abdomen: no tenderness, no masses palpated. No hepatosplenomegaly. Bowel sounds positive.  Musculoskeletal: no clubbing / cyanosis. No joint deformity upper and lower extremities.  Skin: no rashes, lesions, ulcers.  Neurologic: No gross focal neurologic deficit. Psychiatric: Normal mood and affect.   Labs on Admission: I have personally reviewed following labs and imaging studies  CBC: Recent Labs  Lab 06/21/19 0928  WBC 4.4  HGB 13.2  HCT 39.0  MCV 89.9  PLT 200   Basic Metabolic Panel: Recent Labs  Lab 06/21/19 0928  NA 136  K 3.6  CL 106  CO2 17*  GLUCOSE 93  BUN 12  CREATININE 1.03*  CALCIUM 8.1*   GFR: Estimated Creatinine Clearance: 63.7 mL/min (A) (by C-G formula based on SCr of 1.03 mg/dL (H)). Liver Function Tests: Recent Labs  Lab 06/21/19 0928  AST 38  ALT 21  ALKPHOS 70  BILITOT 1.0  PROT 7.8  ALBUMIN 3.9   No results for input(s): LIPASE, AMYLASE in the  last 168 hours. No results for input(s): AMMONIA in the last 168 hours. Coagulation Profile: No results for input(s): INR, PROTIME in the last 168 hours. Cardiac Enzymes: No results for input(s): CKTOTAL, CKMB, CKMBINDEX, TROPONINI in the last 168 hours. BNP (last 3 results) No results for input(s): PROBNP in the last 8760 hours. HbA1C: No results for input(s): HGBA1C in the last 72 hours. CBG: No results for input(s): GLUCAP in the last 168 hours. Lipid Profile: No results for input(s): CHOL, HDL, LDLCALC, TRIG, CHOLHDL, LDLDIRECT in the  last 72 hours. Thyroid Function Tests: No results for input(s): TSH, T4TOTAL, FREET4, T3FREE, THYROIDAB in the last 72 hours. Anemia Panel: No results for input(s): VITAMINB12, FOLATE, FERRITIN, TIBC, IRON, RETICCTPCT in the last 72 hours. Urine analysis:    Component Value Date/Time   COLORURINE STRAW (A) 06/21/2019 1002   APPEARANCEUR CLEAR (A) 06/21/2019 1002   APPEARANCEUR Cloudy 12/27/2011 1538   LABSPEC 1.015 06/21/2019 1002   LABSPEC 1.026 12/27/2011 1538   PHURINE 5.0 06/21/2019 1002   GLUCOSEU NEGATIVE 06/21/2019 1002   GLUCOSEU Negative 12/27/2011 1538   HGBUR SMALL (A) 06/21/2019 1002   BILIRUBINUR NEGATIVE 06/21/2019 1002   BILIRUBINUR Negative 12/27/2011 1538   KETONESUR NEGATIVE 06/21/2019 1002   PROTEINUR 100 (A) 06/21/2019 1002   UROBILINOGEN 0.2 05/25/2010 1001   NITRITE NEGATIVE 06/21/2019 1002   LEUKOCYTESUR NEGATIVE 06/21/2019 1002   LEUKOCYTESUR Trace 12/27/2011 1538    Radiological Exams on Admission: CT Head Wo Contrast  Result Date: 06/21/2019 CLINICAL DATA:  Seizure. EXAM: CT HEAD WITHOUT CONTRAST TECHNIQUE: Contiguous axial images were obtained from the base of the skull through the vertex without intravenous contrast. COMPARISON:  12/27/2011 head CT FINDINGS: Brain: No evidence of parenchymal hemorrhage or extra-axial fluid collection. No mass lesion, mass effect, or midline shift. No CT evidence of acute  infarction. Cerebral volume is age appropriate. No ventriculomegaly. Vascular: No acute abnormality. Skull: No evidence of calvarial fracture. Sinuses/Orbits: The visualized paranasal sinuses are essentially clear. Other:  The mastoid air cells are unopacified. IMPRESSION: Negative head CT. No evidence of acute intracranial abnormality. No evidence of calvarial fracture. Electronically Signed   By: Ilona Sorrel M.D.   On: 06/21/2019 11:13   DG Chest Portable 1 View  Result Date: 06/21/2019 CLINICAL DATA:  Pt states seizure this morning. No hx of seizures. Hx of MI, heart cath. Current smoker EXAM: PORTABLE CHEST 1 VIEW COMPARISON:  Chest radiograph 04/13/2019 FINDINGS: Cardiomediastinal contours are within normal limits. The lungs are clear. No pneumothorax or significant pleural effusion. The visualized skeletal structures are unremarkable. IMPRESSION: No acute cardiopulmonary process. Electronically Signed   By: Audie Pinto M.D.   On: 06/21/2019 10:49    EKG: Independently reviewed. Sinus tachycardia Incomplete right bundle branch block  Assessment/Plan Principal Problem:   Seizure ([place]) Active Problems:   CAD in native artery   Opioid dependence ([place])   Depression   Seizure disorder ([place])    New onset seizure Unclear etiology Patient had a negative CT scan of the head without contrast We will obtain EEG as well as MRI of the brain without contrast to rule out a brain lesion Place patient on seizure precautions We will consult neurology   Coronary artery disease Stable Continue Aspirin , Plavix, metoprolol and Atorvastatin  History of opioid dependence Continue Suboxone   Depression Stable Continue buspirone   DVT prophylaxis: Lovenox Code Status: Full Family Communication: Greater than 50% of time was spent discussing plan of care with patient at the bedside. All questions and concerns have been addressed. She verbalizes understanding and agrees with the  plan Disposition Plan: Back to previous home environment Consults called: Neurology    Collier Bullock MD [facility]     06/21/2019, 12:44 PM

## 2019-06-21 NOTE — Consult Note (Signed)
Reason for Consult:Seizure Requesting Physician: Quale  CC: Seizure  I have been asked by Dr. Jacqualine Code to see this patient in consultation for new onset seizure.  HPI: Molly Dickerson is an 39 y.o. female with a history of MI, CKD, DVT and remote drug abuse on Suboxone who reports getting up this morning to take her medication.  Went back to bed and was noted by her son and husband to be having what they describe as a generalized tonic-clonic seizure.  EMS was called.  Patient with no further seizures since presentation.  No prior history of seizures.    Past Medical History:  Diagnosis Date  . Anxiety   . Chronic kidney disease   . Depression   . Drug addiction [facility])    h/o addiction to pain pills  . DVT (deep venous thrombosis) ([place]) 2010   post partum  . Kidney stones   . MI (myocardial infarction) [facility])     Past Surgical History:  Procedure Laterality Date  . KIDNEY STONE SURGERY    . LAPAROSCOPIC TUBAL LIGATION  09/26/2010   Procedure: LAPAROSCOPIC TUBAL LIGATION;  Surgeon: Osborne Oman, MD;  Location: [place] ORS;  Service: Gynecology;  Laterality: Bilateral;  . LEFT HEART CATH AND CORONARY ANGIOGRAPHY N/A 04/01/2019   Procedure: LEFT HEART CATH AND CORONARY ANGIOGRAPHY;  Surgeon: Nelva Bush, MD;  Location: [place] CV LAB;  Service: Cardiovascular;  Laterality: N/A;  . NASAL SINUS SURGERY      Family History  Problem Relation Age of Onset  . Heart disease Neg Hx   Mother with cancer.  Son with DM.  Social History:  reports that she has been smoking cigarettes. She has been smoking about 0.50 packs per day. She has never used smokeless tobacco. She reports current alcohol use. She reports previous drug use.  No Known Allergies  Medications: I have reviewed the patient's current medications. Prior to Admission medications   Medication Sig Start Date End Date Taking? Authorizing Provider  aspirin 81 MG chewable tablet Chew 1 tablet (81 mg total) by mouth daily.  04/05/19   Dunn, Areta Haber, PA-C  atorvastatin (LIPITOR) 80 MG tablet Take 1 tablet (80 mg total) by mouth daily at 6 PM. 04/04/19   Dunn, Areta Haber, PA-C  buprenorphine-naloxone (SUBOXONE) 8-2 mg SUBL SL tablet Place 1 tablet under the tongue 3 (three) times daily. 03/28/19   [provider]  busPIRone (BUSPAR) 10 MG tablet Take 10 mg by mouth 2 (two) times daily. 11/01/18   [provider]  clopidogrel (PLAVIX) 75 MG tablet Take 1 tablet (75 mg total) by mouth daily. 04/05/19   Dunn, Areta Haber, PA-C  colchicine 0.6 MG tablet Take 1 tablet (0.6 mg total) by mouth 2 (two) times daily. 04/14/19   Fritzi Mandes, MD  metoprolol tartrate (LOPRESSOR) 25 MG tablet Take 0.5 tablets (12.5 mg total) by mouth 2 (two) times daily. 04/04/19   Dunn, Areta Haber, PA-C  nicotine (NICODERM CQ - DOSED IN MG/24 HOURS) 14 mg/24hr patch Place 1 patch (14 mg total) onto the skin daily. 04/05/19   Dunn, Areta Haber, PA-C  pantoprazole (PROTONIX) 40 MG tablet Take 1 tablet (40 mg total) by mouth daily. 04/14/19   Fritzi Mandes, MD    ROS: History obtained from the patient  General ROS: negative for - chills, fatigue, fever, night sweats, weight gain or weight loss Psychological ROS: negative for - behavioral disorder, hallucinations, memory difficulties, mood swings or suicidal ideation Ophthalmic ROS: negative for -  blurry vision, double vision, eye pain or loss of vision ENT ROS: throat pain Allergy and Immunology ROS: negative for - hives or itchy/watery eyes Hematological and Lymphatic ROS: negative for - bleeding problems, bruising or swollen lymph nodes Endocrine ROS: negative for - galactorrhea, hair pattern changes, polydipsia/polyuria or temperature intolerance Respiratory ROS: negative for - cough, hemoptysis, shortness of breath or wheezing Cardiovascular ROS: negative for - chest pain, dyspnea on exertion, edema or irregular heartbeat Gastrointestinal ROS: negative for - abdominal pain, diarrhea, hematemesis,  nausea/vomiting or stool incontinence Genito-Urinary ROS: negative for - dysuria, hematuria, incontinence or urinary frequency/urgency Musculoskeletal ROS: negative for - joint swelling or muscular weakness Neurological ROS: as noted in HPI Dermatological ROS: negative for rash and skin lesion changes  Physical Examination: Blood pressure 126/83, pulse 72, temperature 98.1 F (36.7 C), temperature source Oral, resp. rate 11, height 5' (1.524 m), weight 68 kg, SpO2 96 %, unknown if currently breastfeeding.  HEENT-  Normocephalic, no lesions, without obvious abnormality.  Normal external eye and conjunctiva.  Normal TM's bilaterally.  Normal auditory canals and external ears. Normal external nose, mucus membranes and septum.  Normal pharynx. Cardiovascular- S1, S2 normal, pulses palpable throughout   Lungs- chest clear, no wheezing, rales, normal symmetric air entry Abdomen- soft, non-tender; bowel sounds normal; no masses,  no organomegaly Extremities- mild BLE edema Lymph-no adenopathy palpable Musculoskeletal-no joint tenderness, deformity or swelling Skin-warm and dry, no hyperpigmentation, vitiligo, or suspicious lesions  Neurological Examination   Mental Status: Alert, oriented, thought content appropriate.  Amnestic of recent events.  Speech fluent without evidence of aphasia.  Able to follow 3 step commands without difficulty. Cranial Nerves: II: Discs flat bilaterally; Visual fields grossly normal, pupils equal, round, reactive to light and accommodation III,IV, VI: ptosis not present, extra-ocular motions intact bilaterally V,VII: smile symmetric, facial light touch sensation normal bilaterally VIII: hearing normal bilaterally IX,X: gag reflex present XI: bilateral shoulder shrug XII: midline tongue extension Motor: Right : Upper extremity   5/5    Left:     Upper extremity   5/5  Lower extremity   5/5     Lower extremity   5/5 Tone and bulk:normal tone throughout; no atrophy  noted Sensory: Pinprick and light touch intact throughout, bilaterally Deep Tendon Reflexes: Symmetric throughout Plantars: Right: mute   Left: mute Cerebellar: Normal finger-to-nose and normal heel-to-shin testing bilaterally Gait: not tested due to safety concerns  Laboratory Studies:   Basic Metabolic Panel: Recent Labs  Lab 06/21/19 0928  NA 136  K 3.6  CL 106  CO2 17*  GLUCOSE 93  BUN 12  CREATININE 1.03*  CALCIUM 8.1*    Liver Function Tests: Recent Labs  Lab 06/21/19 0928  AST 38  ALT 21  ALKPHOS 70  BILITOT 1.0  PROT 7.8  ALBUMIN 3.9   No results for input(s): LIPASE, AMYLASE in the last 168 hours. No results for input(s): AMMONIA in the last 168 hours.  CBC: Recent Labs  Lab 06/21/19 0928  WBC 4.4  HGB 13.2  HCT 39.0  MCV 89.9  PLT 200    Cardiac Enzymes: No results for input(s): CKTOTAL, CKMB, CKMBINDEX, TROPONINI in the last 168 hours.  BNP: Invalid input(s): POCBNP  CBG: No results for input(s): GLUCAP in the last 168 hours.  Microbiology: Results for orders placed or performed during the hospital encounter of 06/21/19  Respiratory Panel by RT PCR (Flu A&B, Covid) - Nasopharyngeal Swab     Status: None   Collection Time: 06/21/19  11:25 AM   Specimen: Nasopharyngeal Swab  Result Value Ref Range Status   SARS Coronavirus 2 by RT PCR NEGATIVE NEGATIVE Final    Comment: (NOTE) SARS-CoV-2 target nucleic acids are NOT DETECTED. The SARS-CoV-2 RNA is generally detectable in upper respiratoy specimens during the acute phase of infection. The lowest concentration of SARS-CoV-2 viral copies this assay can detect is 131 copies/mL. A negative result does not preclude SARS-Cov-2 infection and should not be used as the sole basis for treatment or other patient management decisions. A negative result may occur with  improper specimen collection/handling, submission of specimen other than nasopharyngeal swab, presence of viral mutation(s) within  the areas targeted by this assay, and inadequate number of viral copies (<131 copies/mL). A negative result must be combined with clinical observations, patient history, and epidemiological information. The expected result is Negative. Fact Sheet for Patients:  https://www.moore.com/ Fact Sheet for Healthcare Providers:  https://www.young.biz/ This test is not yet ap proved or cleared by the [place] FDA and  has been authorized for detection and/or diagnosis of SARS-CoV-2 by FDA under an Emergency Use Authorization (EUA). This EUA will remain  in effect (meaning this test can be used) for the duration of the COVID-19 declaration under Section 564(b)(1) of the Act, 21 U.S.C. section 360bbb-3(b)(1), unless the authorization is terminated or revoked sooner.    Influenza A by PCR NEGATIVE NEGATIVE Final   Influenza B by PCR NEGATIVE NEGATIVE Final    Comment: (NOTE) The Xpert Xpress SARS-CoV-2/FLU/RSV assay is intended as an aid in  the diagnosis of influenza from Nasopharyngeal swab specimens and  should not be used as a sole basis for treatment. Nasal washings and  aspirates are unacceptable for Xpert Xpress SARS-CoV-2/FLU/RSV  testing. Fact Sheet for Patients: https://www.moore.com/ Fact Sheet for Healthcare Providers: https://www.young.biz/ This test is not yet approved or cleared by the [place] FDA and  has been authorized for detection and/or diagnosis of SARS-CoV-2 by  FDA under an Emergency Use Authorization (EUA). This EUA will remain  in effect (meaning this test can be used) for the duration of the  Covid-19 declaration under Section 564(b)(1) of the Act, 21  U.S.C. section 360bbb-3(b)(1), unless the authorization is  terminated or revoked. Performed at [facility], [address] [place], [place] [address]     Coagulation Studies: No results for input(s): LABPROT, INR  in the last 72 hours.  Urinalysis:  Recent Labs  Lab 06/21/19 1002  COLORURINE STRAW*  LABSPEC 1.015  PHURINE 5.0  GLUCOSEU NEGATIVE  HGBUR SMALL*  BILIRUBINUR NEGATIVE  KETONESUR NEGATIVE  PROTEINUR 100*  NITRITE NEGATIVE  LEUKOCYTESUR NEGATIVE    Lipid Panel:     Component Value Date/Time   CHOL 96 04/14/2019 0501   TRIG 53 04/14/2019 0501   HDL 45 04/14/2019 0501   CHOLHDL 2.1 04/14/2019 0501   VLDL 11 04/14/2019 0501   LDLCALC 40 04/14/2019 0501    HgbA1C:  Lab Results  Component Value Date   HGBA1C 4.6 (L) 04/01/2019    Urine Drug Screen:      Component Value Date/Time   LABOPIA NONE DETECTED 06/21/2019 1002   LABOPIA NONE DETECTED 06/01/2010 1414   COCAINSCRNUR NONE DETECTED 06/21/2019 1002   LABBENZ NONE DETECTED 06/21/2019 1002   LABBENZ NONE DETECTED 06/01/2010 1414   AMPHETMU NONE DETECTED 06/21/2019 1002   AMPHETMU NONE DETECTED 06/01/2010 1414   THCU NONE DETECTED 06/21/2019 1002   THCU NONE DETECTED 06/01/2010 1414   LABBARB NONE DETECTED 06/21/2019  1002   LABBARB  06/01/2010 1414    NONE DETECTED        DRUG SCREEN FOR MEDICAL PURPOSES ONLY.  IF CONFIRMATION IS NEEDED FOR ANY PURPOSE, NOTIFY LAB WITHIN 5 DAYS.        LOWEST DETECTABLE LIMITS FOR URINE DRUG SCREEN Drug Class       Cutoff (ng/mL) Amphetamine      1000 Barbiturate      200 Benzodiazepine   200 Tricyclics       300 Opiates          300 Cocaine          300 THC              50    Alcohol Level: No results for input(s): ETH in the last 168 hours.  Other results: EKG: sinus tachycardia at 109 bpm.  Imaging: CT Head Wo Contrast  Result Date: 06/21/2019 CLINICAL DATA:  Seizure. EXAM: CT HEAD WITHOUT CONTRAST TECHNIQUE: Contiguous axial images were obtained from the base of the skull through the vertex without intravenous contrast. COMPARISON:  12/27/2011 head CT FINDINGS: Brain: No evidence of parenchymal hemorrhage or extra-axial fluid collection. No mass lesion, mass  effect, or midline shift. No CT evidence of acute infarction. Cerebral volume is age appropriate. No ventriculomegaly. Vascular: No acute abnormality. Skull: No evidence of calvarial fracture. Sinuses/Orbits: The visualized paranasal sinuses are essentially clear. Other:  The mastoid air cells are unopacified. IMPRESSION: Negative head CT. No evidence of acute intracranial abnormality. No evidence of calvarial fracture. Electronically Signed   By: Delbert Phenix M.D.   On: 06/21/2019 11:13   DG Chest Portable 1 View  Result Date: 06/21/2019 CLINICAL DATA:  Pt states seizure this morning. No hx of seizures. Hx of MI, heart cath. Current smoker EXAM: PORTABLE CHEST 1 VIEW COMPARISON:  Chest radiograph 04/13/2019 FINDINGS: Cardiomediastinal contours are within normal limits. The lungs are clear. No pneumothorax or significant pleural effusion. The visualized skeletal structures are unremarkable. IMPRESSION: No acute cardiopulmonary process. Electronically Signed   By: Emmaline Kluver M.D.   On: 06/21/2019 10:49     Assessment/Plan: 38 y.o. female with a history of MI, CKD, DVT and remote drug abuse on Suboxone who reports getting up this morning to take her medication.  Went back to bed and was noted by her son and husband to be having what they describe as a generalized tonic-clonic seizure.  Patient with no prior history of seizures.  Etiology unclear.  Head CT personally reviewed and shows no acute changes.  UDS negative.  Further work up recommended.    Recommendations: 1. EEG 2. MRI of the brain with and without contrast 3. Serum magnesium and phosphorus 4. Seizure precautions 5. Patient with first seizure.  If work up unremarkable would not start anticonvulsant therapy at this time.   6. Patient unable to drive, operate heavy machinery, perform activities at heights and participate in water activities until release by outpatient physician. 7. Patient to follow up with neurology on an outpatient  basis.   Thana Farr, MD Neurology [phone] 06/21/2019, 12:45 PM

## 2019-06-21 NOTE — ED Provider Notes (Signed)
[facility] Emergency Department Provider Note   ____________________________________________   First MD Initiated Contact with Patient 06/21/19 0945     (approximate)  I have reviewed the triage vital signs and the nursing notes.   HISTORY  Chief Complaint Seizures    HPI Molly Dickerson is a 39 y.o. female history of chronic kidney disease, previous DVT, MI, angina  Patient presents today for possible seizure.  Patient does not recall event, but patient's boyfriend Riki Rusk, reports that he was resting in bed when she started shaking foaming at the mouth and he felt like she possibly had a seizure.  EMS was called.  Patient reports she thinks she urinated on herself.  She does not recall what happened.  She woke up to the EMS providers in her home.  She has never had a seizure before.  She is not been feeling sick recently, she does have a history of some heart disease now but is compliant with her medications.  Denies any substance abuse or nonprescribed drug use  She has never had symptoms like this before.  Ports she is not sure what happened and went to bed as normal feeling fine last night.  Currently not any pain or discomfort, just reports she feels confused as about what happened this morning   Past Medical History:  Diagnosis Date  . Anxiety   . Chronic kidney disease   . Depression   . Drug addiction [facility])    h/o addiction to pain pills  . DVT (deep venous thrombosis) (HCC) 2010   post partum  . Kidney stones   . MI (myocardial infarction) [facility])     Patient Active Problem List   Diagnosis Date Noted  . Chest pain 04/14/2019  . Unstable angina (HCC) 04/13/2019  . History of ST elevation myocardial infarction (STEMI) 04/13/2019  . Hypotension due to medication - nitroglycerin SL 04/13/2019  . Normocytic anemia 04/13/2019  . Pericarditis 04/13/2019  . CAD in native artery 04/04/2019  . Substance abuse (HCC) 04/04/2019  . Opioid  dependence (HCC) 04/04/2019  . Anemia 04/04/2019  . Hypokalemia 04/04/2019  . Leukocytosis 04/04/2019  . Deep vein thrombosis (DVT), postpartum 04/04/2019  . Tobacco use 04/04/2019  . STEMI involving left anterior descending coronary artery (HCC) 04/01/2019    Past Surgical History:  Procedure Laterality Date  . KIDNEY STONE SURGERY    . LAPAROSCOPIC TUBAL LIGATION  09/26/2010   Procedure: LAPAROSCOPIC TUBAL LIGATION;  Surgeon: Tereso Newcomer, MD;  Location: [facility] ORS;  Service: Gynecology;  Laterality: Bilateral;  . LEFT HEART CATH AND CORONARY ANGIOGRAPHY N/A 04/01/2019   Procedure: LEFT HEART CATH AND CORONARY ANGIOGRAPHY;  Surgeon: Yvonne Kendall, MD;  Location: [facility] INVASIVE CV LAB;  Service: Cardiovascular;  Laterality: N/A;  . NASAL SINUS SURGERY      Prior to Admission medications   Medication Sig Start Date End Date Taking? Authorizing Provider  aspirin 81 MG chewable tablet Chew 1 tablet (81 mg total) by mouth daily. 04/05/19   Dunn, Raymon Mutton, PA-C  atorvastatin (LIPITOR) 80 MG tablet Take 1 tablet (80 mg total) by mouth daily at 6 PM. 04/04/19   Dunn, Raymon Mutton, PA-C  buprenorphine-naloxone (SUBOXONE) 8-2 mg SUBL SL tablet Place 1 tablet under the tongue 3 (three) times daily. 03/28/19   [provider]  busPIRone (BUSPAR) 10 MG tablet Take 10 mg by mouth 2 (two) times daily. 11/01/18   [provider]  clopidogrel (PLAVIX) 75 MG tablet Take 1 tablet (  75 mg total) by mouth daily. 04/05/19   Dunn, Raymon Mutton, PA-C  colchicine 0.6 MG tablet Take 1 tablet (0.6 mg total) by mouth 2 (two) times daily. 04/14/19   Enedina Finner, MD  metoprolol tartrate (LOPRESSOR) 25 MG tablet Take 0.5 tablets (12.5 mg total) by mouth 2 (two) times daily. 04/04/19   Dunn, Raymon Mutton, PA-C  nicotine (NICODERM CQ - DOSED IN MG/24 HOURS) 14 mg/24hr patch Place 1 patch (14 mg total) onto the skin daily. 04/05/19   Dunn, Raymon Mutton, PA-C  pantoprazole (PROTONIX) 40 MG tablet Take 1 tablet (40 mg total) by mouth daily.  04/14/19   Enedina Finner, MD    Allergies Patient has no known allergies.  Family History  Problem Relation Age of Onset  . Heart disease Neg Hx     Social History Social History   Tobacco Use  . Smoking status: Current Every Day Smoker    Packs/day: 0.50    Types: Cigarettes  . Smokeless tobacco: Never Used  Substance Use Topics  . Alcohol use: Yes    Comment: few drinks a week.  . Drug use: Not Currently    Comment: history of opioid abuse; on suboxone x 8 hears    Review of Systems Constitutional: No fever/chills Eyes: No visual changes. ENT: No sore throat. Cardiovascular: Denies chest pain except feels achy along the left side of the ribs.  Denies chest pain now.  Reports it feels like an achiness on the left ribs. Respiratory: Denies shortness of breath. Gastrointestinal: No abdominal pain.   Genitourinary: Negative for dysuria.  Urinate on herself. Musculoskeletal: Negative for back pain. Skin: Negative for rash. Neurological: Negative for headaches, areas of focal weakness or numbness.    ____________________________________________   PHYSICAL EXAM:  VITAL SIGNS: ED Triage Vitals  Enc Vitals Group     BP 06/21/19 0925 (!) 151/113     Pulse Rate 06/21/19 0925 (!) 107     Resp 06/21/19 0925 16     Temp 06/21/19 0925 98.1 F (36.7 C)     Temp Source 06/21/19 0925 Oral     SpO2 06/21/19 0925 98 %     Weight 06/21/19 0925 150 lb (68 kg)     Height 06/21/19 0925 5' (1.524 m)     Head Circumference --      Peak Flow --      Pain Score 06/21/19 0926 5     Pain Loc --      Pain Edu? --      Excl. in GC? --     Constitutional: Alert and oriented. Well appearing and in no acute distress.  Pleasant and in no distress. Eyes: Conjunctivae are normal. Head: Atraumatic. Nose: No congestion/rhinnorhea. Mouth/Throat: Mucous membranes are moist. Neck: No stridor.  Cardiovascular: Normal rate, regular rhythm. Grossly normal heart sounds.  Good peripheral  circulation. Respiratory: Normal respiratory effort.  No retractions. Lungs CTAB. Gastrointestinal: Soft and nontender. No distention. Musculoskeletal: No lower extremity tenderness nor edema. Neurologic:  Normal speech and language. No gross focal neurologic deficits are appreciated.  Moves all extremities with normal strength.  No pronator drift in extremities.  Clear speech.  Normal extraocular movements. Skin:  Skin is warm, dry and intact. No rash noted. Psychiatric: Mood and affect are normal. Speech and behavior are normal.  ____________________________________________   LABS (all labs ordered are listed, but only abnormal results are displayed)  Labs Reviewed  COMPREHENSIVE METABOLIC PANEL - Abnormal; Notable for the following components:  Result Value   CO2 17 (*)    Creatinine, Ser 1.03 (*)    Calcium 8.1 (*)    All other components within normal limits  URINALYSIS, COMPLETE (UACMP) WITH MICROSCOPIC - Abnormal; Notable for the following components:   Color, Urine STRAW (*)    APPearance CLEAR (*)    Hgb urine dipstick SMALL (*)    Protein, ur 100 (*)    All other components within normal limits  RESPIRATORY PANEL BY RT PCR (FLU A&B, COVID)  CBC  URINE DRUG SCREEN, QUALITATIVE ([facility] ONLY)  POC URINE PREG, ED  POCT PREGNANCY, URINE  TROPONIN I (HIGH SENSITIVITY)   ____________________________________________  EKG  Reviewed inter by me at 930 Heart rate 110 QRS 110 QTc 460 Mild sinus tachycardia, no ST elevation.  Slight baseline wander. ____________________________________________  RADIOLOGY  CT Head Wo Contrast  Result Date: 06/21/2019 CLINICAL DATA:  Seizure. EXAM: CT HEAD WITHOUT CONTRAST TECHNIQUE: Contiguous axial images were obtained from the base of the skull through the vertex without intravenous contrast. COMPARISON:  12/27/2011 head CT FINDINGS: Brain: No evidence of parenchymal hemorrhage or extra-axial fluid collection. No mass lesion, mass  effect, or midline shift. No CT evidence of acute infarction. Cerebral volume is age appropriate. No ventriculomegaly. Vascular: No acute abnormality. Skull: No evidence of calvarial fracture. Sinuses/Orbits: The visualized paranasal sinuses are essentially clear. Other:  The mastoid air cells are unopacified. IMPRESSION: Negative head CT. No evidence of acute intracranial abnormality. No evidence of calvarial fracture. Electronically Signed   By: Ilona Sorrel M.D.   On: 06/21/2019 11:13   DG Chest Portable 1 View  Result Date: 06/21/2019 CLINICAL DATA:  Pt states seizure this morning. No hx of seizures. Hx of MI, heart cath. Current smoker EXAM: PORTABLE CHEST 1 VIEW COMPARISON:  Chest radiograph 04/13/2019 FINDINGS: Cardiomediastinal contours are within normal limits. The lungs are clear. No pneumothorax or significant pleural effusion. The visualized skeletal structures are unremarkable. IMPRESSION: No acute cardiopulmonary process. Electronically Signed   By: Audie Pinto M.D.   On: 06/21/2019 10:49    CT head negative for acute disease.  Chest x-ray negative for acute ____________________________________________   PROCEDURES  Procedure(s) performed: None  Procedures  Critical Care performed: No  ____________________________________________   INITIAL IMPRESSION / ASSESSMENT AND PLAN / ED COURSE  Pertinent labs & imaging results that were available during my care of the patient were reviewed by me and considered in my medical decision making (see chart for details).   Patient is for evaluation of possible seizure.  Clinical history suggest possible seizure as she was urinated on herself and seemed confused after the episode now alert and oriented.  She is never had a previous seizure, and has had history of coronary disease in the past.  She does not complain of any chest pain now except for slight achy discomfort in her left lower ribs this seems very atypical.  EKG  reassuring  Differential diagnosis includes seizure, possible arrhythmia, syncope seems unlikely given she was in the bed at the time, there is no history of trauma, and other etiologies.  Will evaluate broadly.  Check electrolytes, troponin, CT of the head.  Consult placed with Dr. Doy Mince  Clinical Course as of Jun 21 1142  Sat Jun 21, 2019  1133 Dr. Doy Mince recommends admission for stroke first-time seizure work-up.  Discussed with patient who is awake alert understanding and agreeable with plan.   [MQ]    Clinical Course User Index [MQ] Delman Kitten, MD  Admission discussed with Dr. Joylene Igo  ____________________________________________   FINAL CLINICAL IMPRESSION(S) / ED DIAGNOSES  Final diagnoses:  Seizure [facility])        Note:  This document was prepared using Dragon voice recognition software and may include unintentional dictation errors       Sharyn Creamer, MD 06/21/19 1144

## 2019-06-21 NOTE — Progress Notes (Signed)
Pt stated that her and her BF live in a hotel. Recently moved from a hotel in [place] area to [place]. Does not know name of hotel at this time.

## 2019-06-22 LAB — CBC
HCT: 35.7 % — ABNORMAL LOW (ref 36.0–46.0)
Hemoglobin: 12 g/dL (ref 12.0–15.0)
MCH: 30.3 pg (ref 26.0–34.0)
MCHC: 33.6 g/dL (ref 30.0–36.0)
MCV: 90.2 fL (ref 80.0–100.0)
Platelets: 185 10*3/uL (ref 150–400)
RBC: 3.96 MIL/uL (ref 3.87–5.11)
RDW: 12.9 % (ref 11.5–15.5)
WBC: 5.2 10*3/uL (ref 4.0–10.5)
nRBC: 0 % (ref 0.0–0.2)

## 2019-06-22 LAB — BASIC METABOLIC PANEL
Anion gap: 8 (ref 5–15)
BUN: 16 mg/dL (ref 6–20)
CO2: 24 mmol/L (ref 22–32)
Calcium: 8.5 mg/dL — ABNORMAL LOW (ref 8.9–10.3)
Chloride: 104 mmol/L (ref 98–111)
Creatinine, Ser: 0.92 mg/dL (ref 0.44–1.00)
GFR calc Af Amer: >60 mL/min (ref >60)
GFR calc non Af Amer: >60 mL/min (ref >60)
Glucose, Bld: 120 mg/dL — ABNORMAL HIGH (ref 70–99)
Potassium: 3.3 mmol/L — ABNORMAL LOW (ref 3.5–5.1)
Sodium: 136 mmol/L (ref 135–145)

## 2019-06-22 MED ORDER — POTASSIUM CHLORIDE CRYS ER 20 MEQ PO TBCR
40.0000 meq | EXTENDED_RELEASE_TABLET | Freq: Once | ORAL | Status: AC
  Administered 2019-06-22: 09:00:00 40 meq via ORAL
  Filled 2019-06-22: qty 2

## 2019-06-22 NOTE — Progress Notes (Signed)
Was called into pt room by pt. Pt asked if she was allowed to go outside. I explained to the pt that we could not allow her to go outside due to safety concerns since she was in for seizures. The pt replied "Not even if my husband is right there with me and I'm in a wheelchair?" I reiterated to pt that it was a safety concern and that we could not let her off the floor to go outside. Pt responded with "That's stupid. I can't even leave the floor to go outside and smoke a cigarette even if my husband is with me. If that's the case I want to sign myself out." This nurse tried to talk pt into staying and finding the cause of her seizures. Pt refused. MD made aware of pts wishes. MD said to give pt AMA papers. RN explained ramifications of pt leaving AMA. . AMA papers signed by pt and this nurse.  IV removed intact.

## 2019-06-22 NOTE — Progress Notes (Signed)
Plan of care reviewed with pt. Medications discussed prior to administration. No pain reported; did have an episode of tachycardia and with what the pt described as "hot flashes." Pt reported that drinks alcohol daily and may be withdrawing. Provider was notified and CIWA initiated. PRN Doxepin was given and pt had no further complaints. Seizure precautions maintained; bed low and locked; call bell within reach.

## 2019-06-22 NOTE — Progress Notes (Signed)
PROGRESS NOTE  Molly Dickerson Dickerson:[id number] DOB: 1980-02-23 DOA: 06/21/2019 PCP: Patient, No Pcp Per  Brief History   Molly Dickerson is a 39 y.o. female with medical history significant for coronary artery disease, history of DVT and nicotine dependence who was brought into the emergency room by EMS after she had what appears to be a seizure episode at home.  Patient does not recall the event but her boyfriend reports that he was resting in bed when she started shaking and foaming at the mouth.  Patient was said to have urinated and defecated on herself.  But there was no tongue bite.  He was concerned that she may have had a seizure and so he called EMS.  Patient woke up to the EMS providers in her home. Patient has never had a seizure before and denies any illicit drug use. She states that she is on Suboxone. She admits to occasional alcohol use and denies having symptoms of alcohol withdrawal when she does not drink.  She had gone to bed normal the night prior without any problems. She denies having any chest pain, shortness of breath, nausea, vomiting, diaphoresis or palpitations. She had a CT scan of the head done without contrast which showed no evidence of acute intracranial abnormality. No evidence of calvarial fracture.  ED Course: Patient with no known history of seizures who is seen in the emergency room after a witnessed seizure episode at home.  She will be admitted to the hospital for seizure work-up.  Neurology has been consulted.   Consultants  . Neurology  Procedures  . None  Antibiotics   Anti-infectives (From admission, onward)   None    .  Subjective  The patient is resting comfortably. No new complaints.  Objective   Vitals:  Vitals:   06/22/19 0829 06/22/19 1540  BP: 131/66 (!) 115/40  Pulse:  69  Resp: 18 20  Temp: 98 F (36.7 C) 99.1 F (37.3 C)  SpO2: 98% 98%   Exam:  Constitutional:  . The patient is awake, alert, and oriented x 3. No acute  distress. Respiratory:  . No increased work of breathing. . No wheezes, rales, or rhonchi . No tactile fremitus Cardiovascular:  . Regular rate and rhythm . No murmurs, ectopy, or gallups. . No lateral PMI. No thrills. Abdomen:  . Abdomen is soft, non-tender, non-distended . No hernias, masses, or organomegaly . Normoactive bowel sounds.  Musculoskeletal:  . No cyanosis, clubbing, or edema Skin:  . No rashes, lesions, ulcers . palpation of skin: no induration or nodules Neurologic:  . CN 2-12 intact . Sensation all 4 extremities intact Psychiatric:  . Mental status o Mood, affect appropriate o Orientation to person, place, time  . judgment and insight appear intact  I have personally reviewed the following:   Today's Data  . Vitals, BMP, CBC  Scheduled Meds: . aspirin  81 mg Oral Daily  . atorvastatin  80 mg Oral q1800  . buprenorphine-naloxone  1 tablet Sublingual TID  . busPIRone  10 mg Oral BID  . clopidogrel  75 mg Oral Daily  . colchicine  0.6 mg Oral BID  . enoxaparin (LOVENOX) injection  40 mg Subcutaneous Q24H  . folic acid  1 mg Oral Daily  . metoprolol tartrate  12.5 mg Oral BID  . multivitamin with minerals  1 tablet Oral Daily  . nicotine  14 mg Transdermal Daily  . pantoprazole  40 mg Oral Daily  . thiamine  100 mg  Oral Daily   Or  . thiamine  100 mg Intravenous Daily   Principal Problem:   Seizure ([place]) Active Problems:   CAD in native artery   Opioid dependence ([place])   Depression   Seizure disorder ([place])   LOS: 0 days  A & P  New onset seizure: Likely due to ETOH withdrawal. Overnight patient had symptoms of flushing, anxiety, and tachycardia consistent with withdrawal. She does drink daily at home.  Patient is on a CIWA protocol. Neurology is consulted. Patient also had been on buspar at home for (?) depression. It is unclear if she may have missed a dose or two of this as well. He tox screen on admission was negative for benzodiazepines.   Patient had a negative CT scan of the head without contrast. Out of concern for serious sequelae of ETOH withdrawal, the patient will not be discharged today to allow for continued observation. MRI of the brain demonstrated white matter changes that neurology feels is not consistent with MS at this time. He feels that this more likely represents small vessel ischemic disease in view of the patient's past medical history. When the patient is discharged she is not to drive, operate heavy machinery, perform activities at heights and participate in water activities until she follows up with neurology as outpatient. She will continue on seizure precautions.   Alcohol/Benzodiazepine withdrawal: Overnight patient had symptoms of flushing, anxiety, and tachycardia consistent with withdrawal. These resolved with benzodiazepines. She does drink daily at home.  Patient is on a CIWA protocol. Neurology is consulted. Patient also had been on buspar at home for (?) depression. It is unclear if she may have missed a dose or two of this as well. He tox screen on admission was negative for benzodiazepines.  Coronary artery disease: Stable. Continue Aspirin , Plavix, metoprolol and Atorvastatin.   History of opioid dependence: Continue Suboxone.  Depression: Noted. Patient takes Buspirone for depression at home, yet urine tox was negative for benzodiazepines.  I have seen and examined this patient myself. I have spent 36 minutes in her evaluation and care.  DVT prophylaxis: Lovenox Code Status: Full Family Communication: Greater than 50% of time was spent discussing plan of care with patient at the bedside. All questions and concerns have been addressed. She verbalizes understanding and agrees with the plan Disposition Plan: Back to previous home environment   Molly Besecker, DO [facility] Direct contact: see [email or web address]  7PM-7AM contact night coverage as above 06/22/2019, 4:32 PM  LOS: 0 days

## 2019-06-22 NOTE — Progress Notes (Signed)
Subjective: close to baseline. Does state she is still forgetful.    Past Medical History:  Diagnosis Date  . Anxiety   . Chronic kidney disease   . Depression   . Drug addiction [facility](HCC)    h/o addiction to pain pills  . DVT (deep venous thrombosis) (HCC) 2010   post partum  . Kidney stones   . MI (myocardial infarction) [facility](HCC)     Past Surgical History:  Procedure Laterality Date  . KIDNEY STONE SURGERY    . LAPAROSCOPIC TUBAL LIGATION  09/26/2010   Procedure: LAPAROSCOPIC TUBAL LIGATION;  Surgeon: Tereso NewcomerUgonna A Anyanwu, MD;  Location: [facility];  Service: Gynecology;  Laterality: Bilateral;  . LEFT HEART CATH AND CORONARY ANGIOGRAPHY N/A 04/01/2019   Procedure: LEFT HEART CATH AND CORONARY ANGIOGRAPHY;  Surgeon: Yvonne KendallEnd, Christopher, MD;  Location: [facility] INVASIVE CV LAB;  Service: Cardiovascular;  Laterality: N/A;  . NASAL SINUS SURGERY      Family History  Problem Relation Age of Onset  . Heart disease Neg Hx   Mother with cancer.  Son with DM.  Social History:  reports that she has been smoking cigarettes. She has been smoking about 0.50 packs per day. She has never used smokeless tobacco. She reports current alcohol use. She reports previous drug use.  No Known Allergies  Medications: I have reviewed the patient's current medications. Prior to Admission medications   Medication Sig Start Date End Date Taking? Authorizing Provider  aspirin 81 MG chewable tablet Chew 1 tablet (81 mg total) by mouth daily. 04/05/19   Dunn, Raymon Muttonyan M, PA-C  atorvastatin (LIPITOR) 80 MG tablet Take 1 tablet (80 mg total) by mouth daily at 6 PM. 04/04/19   Dunn, Raymon Muttonyan M, PA-C  buprenorphine-naloxone (SUBOXONE) 8-2 mg SUBL SL tablet Place 1 tablet under the tongue 3 (three) times daily. 03/28/19   [provider]  busPIRone (BUSPAR) 10 MG tablet Take 10 mg by mouth 2 (two) times daily. 11/01/18   [provider]  clopidogrel (PLAVIX) 75 MG tablet Take 1 tablet (75 mg total) by mouth daily. 04/05/19   Dunn,  Raymon Muttonyan M, PA-C  colchicine 0.6 MG tablet Take 1 tablet (0.6 mg total) by mouth 2 (two) times daily. 04/14/19   Enedina FinnerPatel, Sona, MD  metoprolol tartrate (LOPRESSOR) 25 MG tablet Take 0.5 tablets (12.5 mg total) by mouth 2 (two) times daily. 04/04/19   Dunn, Raymon Muttonyan M, PA-C  nicotine (NICODERM CQ - DOSED IN MG/24 HOURS) 14 mg/24hr patch Place 1 patch (14 mg total) onto the skin daily. 04/05/19   Dunn, Raymon Muttonyan M, PA-C  pantoprazole (PROTONIX) 40 MG tablet Take 1 tablet (40 mg total) by mouth daily. 04/14/19   Enedina FinnerPatel, Sona, MD    ROS: History obtained from the patient  General ROS: negative for - chills, fatigue, fever, night sweats, weight gain or weight loss Psychological ROS: negative for - behavioral disorder, hallucinations, memory difficulties, mood swings or suicidal ideation Ophthalmic ROS: negative for - blurry vision, double vision, eye pain or loss of vision ENT ROS: throat pain Allergy and Immunology ROS: negative for - hives or itchy/watery eyes Hematological and Lymphatic ROS: negative for - bleeding problems, bruising or swollen lymph nodes Endocrine ROS: negative for - galactorrhea, hair pattern changes, polydipsia/polyuria or temperature intolerance Respiratory ROS: negative for - cough, hemoptysis, shortness of breath or wheezing Cardiovascular ROS: negative for - chest pain, dyspnea on exertion, edema or irregular heartbeat Gastrointestinal ROS: negative for - abdominal pain, diarrhea, hematemesis, nausea/vomiting or stool incontinence Genito-Urinary  ROS: negative for - dysuria, hematuria, incontinence or urinary frequency/urgency Musculoskeletal ROS: negative for - joint swelling or muscular weakness Neurological ROS: as noted in HPI Dermatological ROS: negative for rash and skin lesion changes  Physical Examination: Blood pressure 131/66, pulse 74, temperature 98 F (36.7 C), temperature source Oral, resp. rate 18, height 5' (1.524 m), weight 68 kg, SpO2 98 %, unknown if currently  breastfeeding.  HEENT-  Normocephalic, no lesions, without obvious abnormality.  Normal external eye and conjunctiva.  Normal TM's bilaterally.  Normal auditory canals and external ears. Normal external nose, mucus membranes and septum.  Normal pharynx. Cardiovascular- S1, S2 normal, pulses palpable throughout   Lungs- chest clear, no wheezing, rales, normal symmetric air entry Abdomen- soft, non-tender; bowel sounds normal; no masses,  no organomegaly Extremities- mild BLE edema Lymph-no adenopathy palpable Musculoskeletal-no joint tenderness, deformity or swelling Skin-warm and dry, no hyperpigmentation, vitiligo, or suspicious lesions  Neurological Examination   Mental Status: Alert, oriented, thought content appropriate.  Amnestic of recent events.  Speech fluent without evidence of aphasia.  Able to follow 3 step commands without difficulty. Cranial Nerves: II: Discs flat bilaterally; Visual fields grossly normal, pupils equal, round, reactive to light and accommodation III,IV, VI: ptosis not present, extra-ocular motions intact bilaterally V,VII: smile symmetric, facial light touch sensation normal bilaterally VIII: hearing normal bilaterally IX,X: gag reflex present XI: bilateral shoulder shrug XII: midline tongue extension Motor: Right : Upper extremity   5/5    Left:     Upper extremity   5/5  Lower extremity   5/5     Lower extremity   5/5 Tone and bulk:normal tone throughout; no atrophy noted Sensory: Pinprick and light touch intact throughout, bilaterally Deep Tendon Reflexes: Symmetric throughout Plantars: Right: mute   Left: mute Cerebellar: Normal finger-to-nose and normal heel-to-shin testing bilaterally Gait: not tested due to safety concerns  Laboratory Studies:   Basic Metabolic Panel: Recent Labs  Lab 06/21/19 0928 06/22/19 0527  NA 136 136  K 3.6 3.3*  CL 106 104  CO2 17* 24  GLUCOSE 93 120*  BUN 12 16  CREATININE 1.03* 0.92  CALCIUM 8.1* 8.5*  MG  2.2  --   PHOS 2.8  --     Liver Function Tests: Recent Labs  Lab 06/21/19 0928  AST 38  ALT 21  ALKPHOS 70  BILITOT 1.0  PROT 7.8  ALBUMIN 3.9   No results for input(s): LIPASE, AMYLASE in the last 168 hours. No results for input(s): AMMONIA in the last 168 hours.  CBC: Recent Labs  Lab 06/21/19 0928 06/22/19 0527  WBC 4.4 5.2  HGB 13.2 12.0  HCT 39.0 35.7*  MCV 89.9 90.2  PLT 200 185    Cardiac Enzymes: No results for input(s): CKTOTAL, CKMB, CKMBINDEX, TROPONINI in the last 168 hours.  BNP: Invalid input(s): POCBNP  CBG: No results for input(s): GLUCAP in the last 168 hours.  Microbiology: Results for orders placed or performed during the hospital encounter of 06/21/19  Respiratory Panel by RT PCR (Flu A&B, Covid) - Nasopharyngeal Swab     Status: None   Collection Time: 06/21/19 11:25 AM   Specimen: Nasopharyngeal Swab  Result Value Ref Range Status   SARS Coronavirus 2 by RT PCR NEGATIVE NEGATIVE Final    Comment: (NOTE) SARS-CoV-2 target nucleic acids are NOT DETECTED. The SARS-CoV-2 RNA is generally detectable in upper respiratoy specimens during the acute phase of infection. The lowest concentration of SARS-CoV-2 viral copies this assay can detect  is 131 copies/mL. A negative result does not preclude SARS-Cov-2 infection and should not be used as the sole basis for treatment or other patient management decisions. A negative result may occur with  improper specimen collection/handling, submission of specimen other than nasopharyngeal swab, presence of viral mutation(s) within the areas targeted by this assay, and inadequate number of viral copies (<131 copies/mL). A negative result must be combined with clinical observations, patient history, and epidemiological information. The expected result is Negative. Fact Sheet for Patients:  https://www.moore.com/ Fact Sheet for Healthcare Providers:   https://www.young.biz/ This test is not yet ap proved or cleared by the [place] FDA and  has been authorized for detection and/or diagnosis of SARS-CoV-2 by FDA under an Emergency Use Authorization (EUA). This EUA will remain  in effect (meaning this test can be used) for the duration of the COVID-19 declaration under Section 564(b)(1) of the Act, 21 U.S.C. section 360bbb-3(b)(1), unless the authorization is terminated or revoked sooner.    Influenza A by PCR NEGATIVE NEGATIVE Final   Influenza B by PCR NEGATIVE NEGATIVE Final    Comment: (NOTE) The Xpert Xpress SARS-CoV-2/FLU/RSV assay is intended as an aid in  the diagnosis of influenza from Nasopharyngeal swab specimens and  should not be used as a sole basis for treatment. Nasal washings and  aspirates are unacceptable for Xpert Xpress SARS-CoV-2/FLU/RSV  testing. Fact Sheet for Patients: https://www.moore.com/ Fact Sheet for Healthcare Providers: https://www.young.biz/ This test is not yet approved or cleared by the [place] FDA and  has been authorized for detection and/or diagnosis of SARS-CoV-2 by  FDA under an Emergency Use Authorization (EUA). This EUA will remain  in effect (meaning this test can be used) for the duration of the  Covid-19 declaration under Section 564(b)(1) of the Act, 21  U.S.C. section 360bbb-3(b)(1), unless the authorization is  terminated or revoked. Performed at [facility], [address] [place], [place] [address]     Coagulation Studies: No results for input(s): LABPROT, INR in the last 72 hours.  Urinalysis:  Recent Labs  Lab 06/21/19 1002  COLORURINE STRAW*  LABSPEC 1.015  PHURINE 5.0  GLUCOSEU NEGATIVE  HGBUR SMALL*  BILIRUBINUR NEGATIVE  KETONESUR NEGATIVE  PROTEINUR 100*  NITRITE NEGATIVE  LEUKOCYTESUR NEGATIVE    Lipid Panel:     Component Value Date/Time   CHOL 96 04/14/2019 0501   TRIG  53 04/14/2019 0501   HDL 45 04/14/2019 0501   CHOLHDL 2.1 04/14/2019 0501   VLDL 11 04/14/2019 0501   LDLCALC 40 04/14/2019 0501    HgbA1C:  Lab Results  Component Value Date   HGBA1C 4.6 (L) 04/01/2019    Urine Drug Screen:      Component Value Date/Time   LABOPIA NONE DETECTED 06/21/2019 1002   LABOPIA NONE DETECTED 06/01/2010 1414   COCAINSCRNUR NONE DETECTED 06/21/2019 1002   LABBENZ NONE DETECTED 06/21/2019 1002   LABBENZ NONE DETECTED 06/01/2010 1414   AMPHETMU NONE DETECTED 06/21/2019 1002   AMPHETMU NONE DETECTED 06/01/2010 1414   THCU NONE DETECTED 06/21/2019 1002   THCU NONE DETECTED 06/01/2010 1414   LABBARB NONE DETECTED 06/21/2019 1002   LABBARB  06/01/2010 1414    NONE DETECTED        DRUG SCREEN FOR MEDICAL PURPOSES ONLY.  IF CONFIRMATION IS NEEDED FOR ANY PURPOSE, NOTIFY LAB WITHIN 5 DAYS.        LOWEST DETECTABLE LIMITS FOR URINE DRUG SCREEN Drug Class       Cutoff (ng/mL) Amphetamine  1000 Barbiturate      200 Benzodiazepine   672 Tricyclics       094 Opiates          300 Cocaine          300 THC              50    Alcohol Level: No results for input(s): ETH in the last 168 hours.  Other results: EKG: sinus tachycardia at 109 bpm.  Imaging: CT Head Wo Contrast  Result Date: 06/21/2019 CLINICAL DATA:  Seizure. EXAM: CT HEAD WITHOUT CONTRAST TECHNIQUE: Contiguous axial images were obtained from the base of the skull through the vertex without intravenous contrast. COMPARISON:  12/27/2011 head CT FINDINGS: Brain: No evidence of parenchymal hemorrhage or extra-axial fluid collection. No mass lesion, mass effect, or midline shift. No CT evidence of acute infarction. Cerebral volume is age appropriate. No ventriculomegaly. Vascular: No acute abnormality. Skull: No evidence of calvarial fracture. Sinuses/Orbits: The visualized paranasal sinuses are essentially clear. Other:  The mastoid air cells are unopacified. IMPRESSION: Negative head CT. No  evidence of acute intracranial abnormality. No evidence of calvarial fracture. Electronically Signed   By: Ilona Sorrel M.D.   On: 06/21/2019 11:13   MR BRAIN W WO CONTRAST  Result Date: 06/21/2019 CLINICAL DATA:  Seizure EXAM: MRI HEAD WITHOUT AND WITH CONTRAST TECHNIQUE: Multiplanar, multiecho pulse sequences of the brain and surrounding structures were obtained without and with intravenous contrast. CONTRAST:  26mL GADAVIST GADOBUTROL 1 MMOL/ML IV SOLN COMPARISON:  CT head 06/21/2019 FINDINGS: Brain: Ventricle size and cerebral volume normal. Negative for acute infarct. Multiple small deep white matter hyperintensities bilaterally. These spare the periventricular white matter. Brainstem and cerebellum normal. Negative for hemorrhage or mass. Normal enhancement postcontrast administration. Vascular: Normal arterial flow voids. Skull and upper cervical spine: Negative Sinuses/Orbits: Negative Other: None IMPRESSION: No acute intracranial abnormality Multiple small deep white matter hyperintensities bilaterally. They spare the periventricular white matter and most likely are due to chronic ischemia. Demyelinating disease is a consideration. Electronically Signed   By: Franchot Gallo M.D.   On: 06/21/2019 18:13   DG Chest Portable 1 View  Result Date: 06/21/2019 CLINICAL DATA:  Pt states seizure this morning. No hx of seizures. Hx of MI, heart cath. Current smoker EXAM: PORTABLE CHEST 1 VIEW COMPARISON:  Chest radiograph 04/13/2019 FINDINGS: Cardiomediastinal contours are within normal limits. The lungs are clear. No pneumothorax or significant pleural effusion. The visualized skeletal structures are unremarkable. IMPRESSION: No acute cardiopulmonary process. Electronically Signed   By: Audie Pinto M.D.   On: 06/21/2019 10:49     Assessment/Plan: 39 y.o. female with a history of MI, CKD, DVT and remote drug abuse on Suboxone who reports getting up this morning to take her medication.  Went back to bed  and was noted by her son and husband to be having what they describe as a generalized tonic-clonic seizure.  Patient with no prior history of seizures.  Etiology unclear.  Head CT personally reviewed and shows no acute changes.  UDS negative.   - No further seizures while in hospital - MRI with white matter changes which I don't think are consistent at this time with Multiple Sclerosis could be due to chronic small vessel disease in setting of prior past medical history - agree on no anti epileptics at this time - pt/ot - possibly d/c today with Neurology follow up and EEG as out patient.  -  Patient unable to drive, operate heavy  machinery, perform activities at heights and participate in water activities until follows up with Neurology as out patient as no clear seizure activity.

## 2019-06-26 NOTE — Discharge Summary (Signed)
Physician Discharge Summary  Molly Dickerson OJJ:[id number] DOB: 1980/05/29 DOA: 06/21/2019  PCP: Patient, No Pcp Per  Admit date: 06/21/2019 Discharge date: 06/26/2019  Recommendations for Outpatient Follow-up:  1. Pt left AMA     Discharge Diagnoses: Principal diagnosis is #1 1. ETOH withdrawal seizure 2. ETOH/Benzodiazepine withdrawal 3. CAD 4. Opioid dependence 5. Depression  Discharge Condition: Left AMA  Disposition:  Left AMA  Diet recommendation:  Left AMA  Filed Weights   06/21/19 0925  Weight: 68 kg    History of present illness:  Molly Dickerson is a 39 y.o. female with medical history significant for coronary artery disease, history of DVT and nicotine dependence who was brought into the emergency room by EMS after she had what appears to be a seizure episode at home.  Patient does not recall the event but her boyfriend reports that he was resting in bed when she started shaking and foaming at the mouth.  Patient was said to have urinated and defecated on herself.  But there was no tongue bite.  He was concerned that she may have had a seizure and so he called EMS.  Patient woke up to the EMS providers in her home. Patient has never had a seizure before and denies any illicit drug use. She states that she is on Suboxone. She admits to occasional alcohol use and denies having symptoms of alcohol withdrawal when she does not drink.  She had gone to bed normal the night prior without any problems. She denies having any chest pain, shortness of breath, nausea, vomiting, diaphoresis or palpitations. She had a CT scan of the head done without contrast which showed no evidence of acute intracranial abnormality. No evidence of calvarial fracture.  ED Course: Patient with no known history of seizures who is seen in the emergency room after a witnessed seizure episode at home.  She will be admitted to the hospital for seizure work-up.  Neurology has been consulted.  Hospital  Course: Neurology evaluated the patient and felt that her seizure was due to ETOH withdrawal.  White matter changes on MRI were not felt to be consistent with Multiple Sclerosis. This was likely due to chronic small vessel disease. This would also be consistent with this patient's history of CAD. No antiepileptics were warranted. Neurology did recommend EEG as outpatient. He recommended that the patient not drive, operate heavy machinery, perform activities at heights and participate in water activities until she had followed up with neurology and had had no further seizure activity.  On the late afternoon of 06/22/2019 the patient demanded that she be allowed to go outside to smoke. There is no smoking allowed on the hospital campus by policy, and the patient was not allowed off of the floor alone as a matter of patient safety. The patient then decided to leave AMA. I discussed with the patient that alcohol withdrawal was a potentially fatal problem and that she was still in withdrawal, although she was getting better. Previously that patient had told me that she intended to stop drinking, but she stated that she understood the consequences of stopping alcohol on her own, and she left AMA.  Today's assessment: S: The patient is resting comfortably. No new complaints. O: Vitals:  Vitals:   06/22/19 0829 06/22/19 1540  BP: 131/66 (!) 115/40  Pulse:  69  Resp: 18 20  Temp: 98 F (36.7 C) 99.1 F (37.3 C)  SpO2: 98% 98%   Exam:  Constitutional:   The patient  is awake, alert, and oriented x 3. No acute distress. Respiratory:   No increased work of breathing.  No wheezes, rales, or rhonchi  No tactile fremitus Cardiovascular:   Regular rate and rhythm  No murmurs, ectopy, or gallups.  No lateral PMI. No thrills. Abdomen:   Abdomen is soft, non-tender, non-distended  No hernias, masses, or organomegaly  Normoactive bowel sounds.  Musculoskeletal:   No cyanosis, clubbing, or  edema Skin:   No rashes, lesions, ulcers  palpation of skin: no induration or nodules Neurologic:   CN 2-12 intact  Sensation all 4 extremities intact Psychiatric:   Mental status ? Mood, affect appropriate ? Orientation to person, place, time   judgment and insight appear intact  Discharge Instructions   Allergies as of 06/22/2019   No Known Allergies     Medication List    ASK your doctor about these medications   aspirin 81 MG chewable tablet Chew 1 tablet (81 mg total) by mouth daily.   atorvastatin 80 MG tablet Commonly known as: LIPITOR Take 1 tablet (80 mg total) by mouth daily at 6 PM.   buprenorphine-naloxone 8-2 mg Subl SL tablet Commonly known as: SUBOXONE Place 1 tablet under the tongue 3 (three) times daily.   busPIRone 10 MG tablet Commonly known as: BUSPAR Take 10 mg by mouth 2 (two) times daily.   clopidogrel 75 MG tablet Commonly known as: PLAVIX Take 1 tablet (75 mg total) by mouth daily.   colchicine 0.6 MG tablet Take 1 tablet (0.6 mg total) by mouth 2 (two) times daily.   metoprolol tartrate 25 MG tablet Commonly known as: LOPRESSOR Take 0.5 tablets (12.5 mg total) by mouth 2 (two) times daily.   nicotine 14 mg/24hr patch Commonly known as: NICODERM CQ - dosed in mg/24 hours Place 1 patch (14 mg total) onto the skin daily.   pantoprazole 40 MG tablet Commonly known as: PROTONIX Take 1 tablet (40 mg total) by mouth daily.      No Known Allergies  The results of significant diagnostics from this hospitalization (including imaging, microbiology, ancillary and laboratory) are listed below for reference.    Significant Diagnostic Studies: CT Head Wo Contrast  Result Date: 06/21/2019 CLINICAL DATA:  Seizure. EXAM: CT HEAD WITHOUT CONTRAST TECHNIQUE: Contiguous axial images were obtained from the base of the skull through the vertex without intravenous contrast. COMPARISON:  12/27/2011 head CT FINDINGS: Brain: No evidence of  parenchymal hemorrhage or extra-axial fluid collection. No mass lesion, mass effect, or midline shift. No CT evidence of acute infarction. Cerebral volume is age appropriate. No ventriculomegaly. Vascular: No acute abnormality. Skull: No evidence of calvarial fracture. Sinuses/Orbits: The visualized paranasal sinuses are essentially clear. Other:  The mastoid air cells are unopacified. IMPRESSION: Negative head CT. No evidence of acute intracranial abnormality. No evidence of calvarial fracture. Electronically Signed   By: Delbert Phenix M.D.   On: 06/21/2019 11:13   MR BRAIN W WO CONTRAST  Result Date: 06/21/2019 CLINICAL DATA:  Seizure EXAM: MRI HEAD WITHOUT AND WITH CONTRAST TECHNIQUE: Multiplanar, multiecho pulse sequences of the brain and surrounding structures were obtained without and with intravenous contrast. CONTRAST:  58mL GADAVIST GADOBUTROL 1 MMOL/ML IV SOLN COMPARISON:  CT head 06/21/2019 FINDINGS: Brain: Ventricle size and cerebral volume normal. Negative for acute infarct. Multiple small deep white matter hyperintensities bilaterally. These spare the periventricular white matter. Brainstem and cerebellum normal. Negative for hemorrhage or mass. Normal enhancement postcontrast administration. Vascular: Normal arterial flow voids. Skull and upper cervical  spine: Negative Sinuses/Orbits: Negative Other: None IMPRESSION: No acute intracranial abnormality Multiple small deep white matter hyperintensities bilaterally. They spare the periventricular white matter and most likely are due to chronic ischemia. Demyelinating disease is a consideration. Electronically Signed   By: Marlan Palau M.D.   On: 06/21/2019 18:13   DG Chest Portable 1 View  Result Date: 06/21/2019 CLINICAL DATA:  Pt states seizure this morning. No hx of seizures. Hx of MI, heart cath. Current smoker EXAM: PORTABLE CHEST 1 VIEW COMPARISON:  Chest radiograph 04/13/2019 FINDINGS: Cardiomediastinal contours are within normal limits. The  lungs are clear. No pneumothorax or significant pleural effusion. The visualized skeletal structures are unremarkable. IMPRESSION: No acute cardiopulmonary process. Electronically Signed   By: Emmaline Kluver M.D.   On: 06/21/2019 10:49    Microbiology: Recent Results (from the past 240 hour(s))  Respiratory Panel by RT PCR (Flu A&B, Covid) - Nasopharyngeal Swab     Status: None   Collection Time: 06/21/19 11:25 AM   Specimen: Nasopharyngeal Swab  Result Value Ref Range Status   SARS Coronavirus 2 by RT PCR NEGATIVE NEGATIVE Final    Comment: (NOTE) SARS-CoV-2 target nucleic acids are NOT DETECTED. The SARS-CoV-2 RNA is generally detectable in upper respiratoy specimens during the acute phase of infection. The lowest concentration of SARS-CoV-2 viral copies this assay can detect is 131 copies/mL. A negative result does not preclude SARS-Cov-2 infection and should not be used as the sole basis for treatment or other patient management decisions. A negative result may occur with  improper specimen collection/handling, submission of specimen other than nasopharyngeal swab, presence of viral mutation(s) within the areas targeted by this assay, and inadequate number of viral copies (<131 copies/mL). A negative result must be combined with clinical observations, patient history, and epidemiological information. The expected result is Negative. Fact Sheet for Patients:  https://www.moore.com/ Fact Sheet for Healthcare Providers:  https://www.young.biz/ This test is not yet ap proved or cleared by the [place] FDA and  has been authorized for detection and/or diagnosis of SARS-CoV-2 by FDA under an Emergency Use Authorization (EUA). This EUA will remain  in effect (meaning this test can be used) for the duration of the COVID-19 declaration under Section 564(b)(1) of the Act, 21 U.S.C. section 360bbb-3(b)(1), unless the authorization is terminated  or revoked sooner.    Influenza A by PCR NEGATIVE NEGATIVE Final   Influenza B by PCR NEGATIVE NEGATIVE Final    Comment: (NOTE) The Xpert Xpress SARS-CoV-2/FLU/RSV assay is intended as an aid in  the diagnosis of influenza from Nasopharyngeal swab specimens and  should not be used as a sole basis for treatment. Nasal washings and  aspirates are unacceptable for Xpert Xpress SARS-CoV-2/FLU/RSV  testing. Fact Sheet for Patients: https://www.moore.com/ Fact Sheet for Healthcare Providers: https://www.young.biz/ This test is not yet approved or cleared by the [place] FDA and  has been authorized for detection and/or diagnosis of SARS-CoV-2 by  FDA under an Emergency Use Authorization (EUA). This EUA will remain  in effect (meaning this test can be used) for the duration of the  Covid-19 declaration under Section 564(b)(1) of the Act, 21  U.S.C. section 360bbb-3(b)(1), unless the authorization is  terminated or revoked. Performed at [facility], [address] [place], [place] [address]      Labs: Basic Metabolic Panel: Recent Labs  Lab 06/21/19 0928 06/22/19 0527  NA 136 136  K 3.6 3.3*  CL 106 104  CO2 17* 24  GLUCOSE 93 120*  BUN 12 16  CREATININE 1.03* 0.92  CALCIUM 8.1* 8.5*  MG 2.2  --   PHOS 2.8  --    Liver Function Tests: Recent Labs  Lab 06/21/19 0928  AST 38  ALT 21  ALKPHOS 70  BILITOT 1.0  PROT 7.8  ALBUMIN 3.9   No results for input(s): LIPASE, AMYLASE in the last 168 hours. No results for input(s): AMMONIA in the last 168 hours. CBC: Recent Labs  Lab 06/21/19 0928 06/22/19 0527  WBC 4.4 5.2  HGB 13.2 12.0  HCT 39.0 35.7*  MCV 89.9 90.2  PLT 200 185   Cardiac Enzymes: No results for input(s): CKTOTAL, CKMB, CKMBINDEX, TROPONINI in the last 168 hours. BNP: BNP (last 3 results) Recent Labs    04/13/19 0403  BNP 111.0*    ProBNP (last 3 results) No results for input(s): PROBNP  in the last 8760 hours.  CBG: No results for input(s): GLUCAP in the last 168 hours.  Principal Problem:   Seizure [facility]) Active Problems:   CAD in native artery   Opioid dependence (HCC)   Depression   Seizure disorder (HCC)   Time coordinating discharge: 38 minutes.  Signed:        Barret Esquivel, DO [facility]  06/26/2019, 2:42 PM
